# Patient Record
Sex: Male | Born: 1965 | Race: White | Hispanic: No | Marital: Married | Smoking: Current every day smoker
Health system: Southern US, Community
[De-identification: ages and names within clinical notes are randomized; demographics above are authoritative.]

## PROBLEM LIST (undated history)

## (undated) DIAGNOSIS — I10 Essential (primary) hypertension: Secondary | ICD-10-CM

## (undated) DIAGNOSIS — J45909 Unspecified asthma, uncomplicated: Secondary | ICD-10-CM

## (undated) DIAGNOSIS — E119 Type 2 diabetes mellitus without complications: Secondary | ICD-10-CM

## (undated) DIAGNOSIS — G4730 Sleep apnea, unspecified: Secondary | ICD-10-CM

## (undated) HISTORY — PX: DENTAL SURGERY: SHX609

---

## 2006-02-28 ENCOUNTER — Inpatient Hospital Stay (HOSPITAL_COMMUNITY): Admission: AD | Admit: 2006-02-28 | Discharge: 2006-03-03 | Payer: Self-pay | Admitting: Psychiatry

## 2006-02-28 ENCOUNTER — Ambulatory Visit: Payer: Self-pay | Admitting: Psychiatry

## 2014-07-06 ENCOUNTER — Encounter (HOSPITAL_COMMUNITY): Payer: Self-pay | Admitting: *Deleted

## 2014-07-06 ENCOUNTER — Emergency Department (HOSPITAL_COMMUNITY)
Admission: EM | Admit: 2014-07-06 | Discharge: 2014-07-06 | Disposition: A | Discharge status: Home / Self Care | Payer: Self-pay | Attending: Emergency Medicine | Admitting: Emergency Medicine

## 2014-07-06 ENCOUNTER — Emergency Department (HOSPITAL_COMMUNITY): Payer: Self-pay

## 2014-07-06 DIAGNOSIS — R062 Wheezing: Secondary | ICD-10-CM | POA: Insufficient documentation

## 2014-07-06 DIAGNOSIS — R51 Headache: Secondary | ICD-10-CM | POA: Insufficient documentation

## 2014-07-06 DIAGNOSIS — I10 Essential (primary) hypertension: Secondary | ICD-10-CM | POA: Insufficient documentation

## 2014-07-06 DIAGNOSIS — Z720 Tobacco use: Secondary | ICD-10-CM | POA: Insufficient documentation

## 2014-07-06 DIAGNOSIS — H538 Other visual disturbances: Secondary | ICD-10-CM

## 2014-07-06 LAB — CBC WITH DIFFERENTIAL/PLATELET
BASOS PCT: 1 % (ref 0–1)
Basophils Absolute: 0.1 10*3/uL (ref 0.0–0.1)
EOS ABS: 0.2 10*3/uL (ref 0.0–0.7)
Eosinophils Relative: 2 % (ref 0–5)
HCT: 43.1 % (ref 39.0–52.0)
Hemoglobin: 14.4 g/dL (ref 13.0–17.0)
Lymphocytes Relative: 27 % (ref 12–46)
Lymphs Abs: 2.4 10*3/uL (ref 0.7–4.0)
MCH: 29.1 pg (ref 26.0–34.0)
MCHC: 33.4 g/dL (ref 30.0–36.0)
MCV: 87.2 fL (ref 78.0–100.0)
MONO ABS: 0.5 10*3/uL (ref 0.1–1.0)
Monocytes Relative: 5 % (ref 3–12)
NEUTROS ABS: 6 10*3/uL (ref 1.7–7.7)
Neutrophils Relative %: 65 % (ref 43–77)
Platelets: 218 10*3/uL (ref 150–400)
RBC: 4.94 MIL/uL (ref 4.22–5.81)
RDW: 13.4 % (ref 11.5–15.5)
WBC: 9.1 10*3/uL (ref 4.0–10.5)

## 2014-07-06 LAB — I-STAT CHEM 8, ED
BUN: 21 mg/dL (ref 6–23)
CALCIUM ION: 1.09 mmol/L — AB (ref 1.12–1.23)
CREATININE: 1.2 mg/dL (ref 0.50–1.35)
Chloride: 103 mmol/L (ref 96–112)
GLUCOSE: 91 mg/dL (ref 70–99)
HCT: 46 % (ref 39.0–52.0)
Hemoglobin: 15.6 g/dL (ref 13.0–17.0)
Potassium: 3.6 mmol/L (ref 3.5–5.1)
Sodium: 141 mmol/L (ref 135–145)
TCO2: 23 mmol/L (ref 0–100)

## 2014-07-06 MED ORDER — LISINOPRIL-HYDROCHLOROTHIAZIDE 20-25 MG PO TABS: 1.0000 | ORAL_TABLET | Freq: Every day | ORAL | Status: DC

## 2014-07-06 NOTE — ED Notes (Signed)
Pt A&OX4, NAD noted. Dr. Maryan Rued aware of BP. Pt denies any other complaints. Pt refused wheelchair on discharge.

## 2014-07-06 NOTE — ED Notes (Signed)
Spoke with MD Maryan Rued regarding patient symptoms, orders received

## 2014-07-06 NOTE — ED Notes (Addendum)
Pt in c/o intermittent blurred vision, floaters in vision, headaches for the last few months, went to his eye doctor today regarding these symptoms and sent here to r/o hemorrhage to vessels in eyes, told he needed a CT scan, pt hypertensive in triage, no known history of this, pt denies any acute changes in symptoms today, just reports that they have been getting progressively worse in the last month

## 2014-07-06 NOTE — ED Notes (Signed)
MD made aware of the plan of care. Patient okay with BP.

## 2014-07-06 NOTE — Progress Notes (Signed)
ED CM was consulted by Dr. Maryan Rued concerning establishing f/u care. Patient was sent here by ophthalmologist for blurred vision and floaters to r/o intrcranial hemorrhage. Patient has not seen a doctor in about 3 years. Patient lives in [place]. Discussed the [facility] in [place] Dr. Moshe Cipro, patient agreeable with plan to follow up tomorrow with establishing care. Patient also given information to on the [facility], patient verbalized appreciation for the assistance. CM will follow up with PCP and patient regarding appt. Updated Dr. Maryan Rued on disposition plan she is agreeable.

## 2014-07-06 NOTE — ED Provider Notes (Addendum)
CSN: [id number]     Arrival date & time 07/06/14  1834 History   First MD Initiated Contact with Patient 07/06/14 1946     Chief Complaint  Patient presents with  . Blurred Vision     (Consider location/radiation/quality/duration/timing/severity/associated sxs/prior Treatment) HPI Comments: Patient presented to the emergency room today because for the last 3 months or so he's had headaches and about a week ago he started having floaters in blurry vision. He saw Dr. Katy Fitch with ophthalmology today who recommended he come here to get a CT scan to rule out intracranial hemorrhage. Patient states he has not seen a doctor in at least 3 years and has no idea if he has any medical problems. Because he does not have insurance he does not see a doctor regularly  Patient is a 49 y.o. male presenting with headaches. The history is provided by the patient.  Headache Pain location:  Generalized Quality:  Dull Radiates to:  Does not radiate Severity currently:  Unable to specify Severity at highest:  Unable to specify Onset quality:  Gradual Duration: 3 months. Timing:  Intermittent Progression:  Waxing and waning Chronicity:  New Associated symptoms: blurred vision   Associated symptoms: no cough, no dizziness, no facial pain, no fever, no focal weakness, no hearing loss, no loss of balance, no photophobia and no weakness   Risk factors comment:  Tobacco abuse.  otherwise does not see a doctor   History reviewed. No pertinent past medical history. History reviewed. No pertinent past surgical history. History reviewed. No pertinent family history. History  Substance Use Topics  . Smoking status: Current Every Day Smoker  . Smokeless tobacco: Not on file  . Alcohol Use: Not on file    Review of Systems  Constitutional: Negative for fever.  HENT: Negative for hearing loss.   Eyes: Positive for blurred vision. Negative for photophobia.  Respiratory: Negative for cough.   Neurological:  Positive for headaches. Negative for dizziness, focal weakness, weakness and loss of balance.  All other systems reviewed and are negative.     Allergies  Review of patient's allergies indicates no known allergies.  Home Medications   Prior to Admission medications   Medication Sig Start Date End Date Taking? Authorizing Provider  naproxen sodium (ANAPROX) 220 MG tablet Take 220 mg by mouth daily as needed (headaches).   Yes Historical Provider, MD   BP 212/128 mmHg  Pulse 88  Temp(Src) 97.9 F (36.6 C) (Oral)  Resp 14  Wt 215 lb 1 oz (97.552 kg)  SpO2 98% Physical Exam  Constitutional: He is oriented to person, place, and time. He appears well-developed and well-nourished. No distress.  HENT:  Head: Normocephalic and atraumatic.  Mouth/Throat: Oropharynx is clear and moist.  Eyes: Conjunctivae and EOM are normal.  Pupils are dilated bilaterally  Neck: Normal range of motion. Neck supple.  Cardiovascular: Normal rate, regular rhythm and intact distal pulses.   No murmur heard. Pulmonary/Chest: Effort normal. No respiratory distress. He has wheezes. He has no rales.  Abdominal: Soft. He exhibits no distension. There is no tenderness. There is no rebound and no guarding.  Musculoskeletal: Normal range of motion. He exhibits no edema or tenderness.  Neurological: He is alert and oriented to person, place, and time. He has normal strength. No cranial nerve deficit or sensory deficit. Coordination and gait normal.  Skin: Skin is warm and dry. No rash noted. No erythema.  Psychiatric: He has a normal mood and affect. His behavior is normal.  Nursing note and vitals reviewed.   ED Course  Procedures (including critical care time) Labs Review Labs Reviewed  I-STAT CHEM 8, ED - Abnormal; Notable for the following:    Calcium, Ion 1.09 (*)    All other components within normal limits  CBC WITH DIFFERENTIAL/PLATELET    Imaging Review Ct Head Wo Contrast  07/06/2014   CLINICAL  DATA:  Intermittent blurry vision. Headaches for last few months.  EXAM: CT HEAD WITHOUT CONTRAST  TECHNIQUE: Contiguous axial images were obtained from the base of the skull through the vertex without intravenous contrast.  COMPARISON:  None.  FINDINGS: There is no evidence of mass effect, midline shift or extra-axial fluid collections. There is no evidence of a space-occupying lesion or intracranial hemorrhage. There is no evidence of a cortical-based area of acute infarction.  The ventricles and sulci are appropriate for the patient's age. The basal cisterns are patent. Prominent cisterna magna.  Visualized portions of the orbits are unremarkable. Right maxillary sinus mucosal thickening. Minimal left maxillary sinus mucosal thickening. Bilateral ethmoid sinus and sphenoid sinus mucosal thickening. Hypoplastic left frontal sinus. Mastoid sinuses are unremarkable. Low-density material within bilateral external auditory canals likely reflecting cerumen.  The osseous structures are unremarkable.  IMPRESSION: 1. Normal CT of the brain without intravenous contrast. 2. Mild sinus disease.   Electronically Signed   By: Kathreen Devoid   On: 07/06/2014 19:39     EKG Interpretation   Date/Time:  Thursday July 06 2014 18:50:44 EST Ventricular Rate:  90 PR Interval:  140 QRS Duration: 84 QT Interval:  368 QTC Calculation: 450 R Axis:   78 Text Interpretation:  Normal sinus rhythm Minimal voltage criteria for  LVH, may be normal variant ST \\T \ T wave abnormality, consider  inferolateral ischemia No previous tracing Confirmed by Maryan Rued  MD,  Loree Fee ([id number]) on 07/06/2014 7:46:50 PM      MDM   Final diagnoses:  Essential hypertension    Patient here with headaches for the last 2-3 months and floaters and blurred vision starting in the last week. Patient saw Dr. Katy Fitch today in the office in speaking with me he saw me he saw vitreous hemorrhage and papilledema and she sent the patient here to rule out  intracranial hemorrhage. Patient denies any unilateral weakness, numbness, speech or swallowing difficulty. He has not seen a doctor in greater than 3 years and does not take care of his health. He abuses tobacco and eats an excessive amount of salty foods. Upon arrival here patient's blood pressure is 220/150 and repeat check was 212/128. Feel most likely that patient's blood pressure has been uncontrolled for some time. Spoke with neurology about patient's ophthalmologic finding and they feel that a CT scan is adequate to rule out intracranial hemorrhage however with the papilledema recommended that he have outpatient neurology follow-up for pseudotumor cerebri, however with patient's elevated blood pressure today the readings with most likely be abnormal. He recommended blood pressure treatment in the meantime. Patient's CBC and Chem-8 are within normal limits. No signs of diabetes at this time. Will start patient on enalapril and hydrochlorothiazide for his blood pressure and given resources in [place] for primary care follow-up. Also given a neurology referral for next week.  Spoke with wanda with case management and [facility] and pt given information.    Blanchie Dessert, MD 07/06/14 2045  Blanchie Dessert, MD 07/06/14 2107

## 2014-07-06 NOTE — Discharge Instructions (Signed)
Blurred Vision You have been seen today complaining of blurred vision. This means you have a loss of ability to see small details.  CAUSES  Blurred vision can be a symptom of underlying eye problems, such as:  Aging of the eye (presbyopia).  Glaucoma.  Cataracts.  Eye infection.  Eye-related migraine.  Diabetes mellitus.  Fatigue.  Migraine headaches.  High blood pressure.  Breakdown of the back of the eye (macular degeneration).  Problems caused by some medications. The most common cause of blurred vision is the need for eyeglasses or a new prescription. Today in the emergency department, no cause for your blurred vision can be found. SYMPTOMS  Blurred vision is the loss of visual sharpness and detail (acuity). DIAGNOSIS  Should blurred vision continue, you should see your caregiver. If your caregiver is your primary care physician, he or she may choose to refer you to another specialist.  TREATMENT  Do not ignore your blurred vision. Make sure to have it checked out to see if further treatment or referral is necessary. SEEK MEDICAL CARE IF:  You are unable to get into a specialist so we can help you with a referral. SEEK IMMEDIATE MEDICAL CARE IF: You have severe eye pain, severe headache, or sudden loss of vision. MAKE SURE YOU:   Understand these instructions.  Will watch your condition.  Will get help right away if you are not doing well or get worse. Document Released: 04/24/2003 Document Revised: 07/14/2011 Document Reviewed: 11/24/2007 [facility] Patient Information 2015 [place], [place]. This information is not intended to replace advice given to you by your health care provider. Make sure you discuss any questions you have with your health care provider.  DASH Eating Plan DASH stands for "Dietary Approaches to Stop Hypertension." The DASH eating plan is a healthy eating plan that has been shown to reduce high blood pressure (hypertension). Additional health  benefits may include reducing the risk of type 2 diabetes mellitus, heart disease, and stroke. The DASH eating plan may also help with weight loss. WHAT DO I NEED TO KNOW ABOUT THE DASH EATING PLAN? For the DASH eating plan, you will follow these general guidelines:  Choose foods with a percent daily value for sodium of less than 5% (as listed on the food label).  Use salt-free seasonings or herbs instead of table salt or sea salt.  Check with your health care provider or pharmacist before using salt substitutes.  Eat lower-sodium products, often labeled as "lower sodium" or "no salt added."  Eat fresh foods.  Eat more vegetables, fruits, and low-fat dairy products.  Choose whole grains. Look for the word "whole" as the first word in the ingredient list.  Choose fish and skinless chicken or [place] more often than red meat. Limit fish, poultry, and meat to 6 oz (170 g) each day.  Limit sweets, desserts, sugars, and sugary drinks.  Choose heart-healthy fats.  Limit cheese to 1 oz (28 g) per day.  Eat more home-cooked food and less restaurant, buffet, and fast food.  Limit fried foods.  Cook foods using methods other than frying.  Limit canned vegetables. If you do use them, rinse them well to decrease the sodium.  When eating at a restaurant, ask that your food be prepared with less salt, or no salt if possible. WHAT FOODS CAN I EAT? Seek help from a dietitian for individual calorie needs. Grains Whole grain or whole wheat bread. Brown rice. Whole grain or whole wheat pasta. Quinoa, bulgur, and whole  grain cereals. Low-sodium cereals. Corn or whole wheat flour tortillas. Whole grain cornbread. Whole grain crackers. Low-sodium crackers. Vegetables Fresh or frozen vegetables (raw, steamed, roasted, or grilled). Low-sodium or reduced-sodium tomato and vegetable juices. Low-sodium or reduced-sodium tomato sauce and paste. Low-sodium or reduced-sodium canned vegetables.  Fruits All  fresh, canned (in natural juice), or frozen fruits. Meat and Other Protein Products Ground beef (85% or leaner), grass-fed beef, or beef trimmed of fat. Skinless chicken or [place]. Ground chicken or [place]. Pork trimmed of fat. All fish and seafood. Eggs. Dried beans, peas, or lentils. Unsalted nuts and seeds. Unsalted canned beans. Dairy Low-fat dairy products, such as skim or 1% milk, 2% or reduced-fat cheeses, low-fat ricotta or cottage cheese, or plain low-fat yogurt. Low-sodium or reduced-sodium cheeses. Fats and Oils Tub margarines without trans fats. Light or reduced-fat mayonnaise and salad dressings (reduced sodium). Avocado. Safflower, olive, or canola oils. Natural peanut or almond butter. Other Unsalted popcorn and pretzels. The items listed above may not be a complete list of recommended foods or beverages. Contact your dietitian for more options. WHAT FOODS ARE NOT RECOMMENDED? Grains White bread. White pasta. White rice. Refined cornbread. Bagels and croissants. Crackers that contain trans fat. Vegetables Creamed or fried vegetables. Vegetables in a cheese sauce. Regular canned vegetables. Regular canned tomato sauce and paste. Regular tomato and vegetable juices. Fruits Dried fruits. Canned fruit in light or heavy syrup. Fruit juice. Meat and Other Protein Products Fatty cuts of meat. Ribs, chicken wings, bacon, sausage, bologna, salami, chitterlings, fatback, hot dogs, bratwurst, and packaged luncheon meats. Salted nuts and seeds. Canned beans with salt. Dairy Whole or 2% milk, cream, half-and-half, and cream cheese. Whole-fat or sweetened yogurt. Full-fat cheeses or blue cheese. Nondairy creamers and whipped toppings. Processed cheese, cheese spreads, or cheese curds. Condiments Onion and garlic salt, seasoned salt, table salt, and sea salt. Canned and packaged gravies. Worcestershire sauce. Tartar sauce. Barbecue sauce. Teriyaki sauce. Soy sauce, including reduced sodium.  Steak sauce. Fish sauce. Oyster sauce. Cocktail sauce. Horseradish. Ketchup and mustard. Meat flavorings and tenderizers. Bouillon cubes. Hot sauce. Tabasco sauce. Marinades. Taco seasonings. Relishes. Fats and Oils Butter, stick margarine, lard, shortening, ghee, and bacon fat. Coconut, palm kernel, or palm oils. Regular salad dressings. Other Pickles and olives. Salted popcorn and pretzels. The items listed above may not be a complete list of foods and beverages to avoid. Contact your dietitian for more information. WHERE CAN I FIND MORE INFORMATION? National Heart, Lung, and Blood Institute: travelstabloid.com Document Released: 04/10/2011 Document Revised: 09/05/2013 Document Reviewed: 02/23/2013 [facility] Patient Information 2015 [place], [place]. This information is not intended to replace advice given to you by your health care provider. Make sure you discuss any questions you have with your health care provider.

## 2015-09-24 ENCOUNTER — Emergency Department (HOSPITAL_COMMUNITY)
Admission: EM | Admit: 2015-09-24 | Discharge: 2015-09-24 | Disposition: A | Discharge status: Home / Self Care | Payer: Self-pay | Attending: Emergency Medicine | Admitting: Emergency Medicine

## 2015-09-24 ENCOUNTER — Encounter (HOSPITAL_COMMUNITY): Payer: Self-pay | Admitting: *Deleted

## 2015-09-24 DIAGNOSIS — I159 Secondary hypertension, unspecified: Secondary | ICD-10-CM

## 2015-09-24 DIAGNOSIS — Z79899 Other long term (current) drug therapy: Secondary | ICD-10-CM | POA: Insufficient documentation

## 2015-09-24 DIAGNOSIS — Z7982 Long term (current) use of aspirin: Secondary | ICD-10-CM | POA: Insufficient documentation

## 2015-09-24 DIAGNOSIS — F17210 Nicotine dependence, cigarettes, uncomplicated: Secondary | ICD-10-CM | POA: Insufficient documentation

## 2015-09-24 DIAGNOSIS — L259 Unspecified contact dermatitis, unspecified cause: Secondary | ICD-10-CM

## 2015-09-24 HISTORY — DX: Essential (primary) hypertension: I10

## 2015-09-24 LAB — CBC WITH DIFFERENTIAL/PLATELET
BASOS PCT: 1 %
Basophils Absolute: 0 10*3/uL (ref 0.0–0.1)
EOS PCT: 3 %
Eosinophils Absolute: 0.3 10*3/uL (ref 0.0–0.7)
HCT: 41.1 % (ref 39.0–52.0)
Hemoglobin: 13.4 g/dL (ref 13.0–17.0)
Lymphocytes Relative: 22 %
Lymphs Abs: 1.9 10*3/uL (ref 0.7–4.0)
MCH: 28.9 pg (ref 26.0–34.0)
MCHC: 32.6 g/dL (ref 30.0–36.0)
MCV: 88.6 fL (ref 78.0–100.0)
Monocytes Absolute: 0.5 10*3/uL (ref 0.1–1.0)
Monocytes Relative: 6 %
Neutro Abs: 5.8 10*3/uL (ref 1.7–7.7)
Neutrophils Relative %: 68 %
PLATELETS: 224 10*3/uL (ref 150–400)
RBC: 4.64 MIL/uL (ref 4.22–5.81)
RDW: 14 % (ref 11.5–15.5)
WBC: 8.5 10*3/uL (ref 4.0–10.5)

## 2015-09-24 LAB — COMPREHENSIVE METABOLIC PANEL
ALT: 29 U/L (ref 17–63)
AST: 31 U/L (ref 15–41)
Albumin: 3.8 g/dL (ref 3.5–5.0)
Alkaline Phosphatase: 93 U/L (ref 38–126)
Anion gap: 7 (ref 5–15)
BUN: 18 mg/dL (ref 6–20)
CHLORIDE: 107 mmol/L (ref 101–111)
CO2: 26 mmol/L (ref 22–32)
CREATININE: 1.49 mg/dL — AB (ref 0.61–1.24)
Calcium: 8.3 mg/dL — ABNORMAL LOW (ref 8.9–10.3)
GFR calc non Af Amer: 53 mL/min — ABNORMAL LOW (ref >60)
Glucose, Bld: 163 mg/dL — ABNORMAL HIGH (ref 65–99)
Potassium: 3.5 mmol/L (ref 3.5–5.1)
Sodium: 140 mmol/L (ref 135–145)
Total Bilirubin: 0.4 mg/dL (ref 0.3–1.2)
Total Protein: 6.9 g/dL (ref 6.5–8.1)

## 2015-09-24 MED ORDER — PREDNISONE 20 MG PO TABS: ORAL_TABLET | ORAL | Status: DC

## 2015-09-24 MED ORDER — PREDNISONE 50 MG PO TABS
60.0000 mg | ORAL_TABLET | Freq: Once | ORAL | Status: AC
  Administered 2015-09-24: 60 mg via ORAL
  Filled 2015-09-24: qty 1

## 2015-09-24 MED ORDER — LISINOPRIL 10 MG PO TABS
10.0000 mg | ORAL_TABLET | Freq: Once | ORAL | Status: AC
  Administered 2015-09-24: 10 mg via ORAL
  Filled 2015-09-24: qty 1

## 2015-09-24 MED ORDER — LISINOPRIL-HYDROCHLOROTHIAZIDE 20-25 MG PO TABS: 1.0000 | ORAL_TABLET | Freq: Every day | ORAL | Status: DC

## 2015-09-24 NOTE — ED Provider Notes (Signed)
CSN: [id number]     Arrival date & time 09/24/15  2110 History  By signing my name below, I, Doran Stabler, attest that this documentation has been prepared under the direction and in the presence of No att. providers found. Electronically Signed: Doran Stabler, ED Scribe. 09/25/2015. 11:30 PM.   Chief Complaint  Patient presents with  . Hypertension  . Poison Ivy   The history is provided by the patient. No language interpreter was used.   HPI Comments: Gary Ho is a 50 y.o. male with a PMHx of HTN who presents to the Emergency Department complaining of poison ivy exposure to his left periorbital area 1 day ago. Pt was cutting poison oak yesterday without safety glasses when he became exposed. Since then, he complains of itchiness and redness to the left  periorbital area. He states his forearms were exposed to poison ivy before and had a reaction. Pt denies any fevers, chills, eye itching, eye drainage, eye pain or any other symptoms at this time.    Pt has not been taken his Lisinopril for the past 6 months. Pt does not see a PCP.   Past Medical History  Diagnosis Date  . Hypertension    History reviewed. No pertinent past surgical history. History reviewed. No pertinent family history. Social History  Substance Use Topics  . Smoking status: Current Every Day Smoker    Types: Cigarettes  . Smokeless tobacco: None  . Alcohol Use: No    Review of Systems  Constitutional: Negative for fever and chills.  Eyes: Negative for pain, discharge and itching.  Skin: Positive for rash.  All other systems reviewed and are negative.   Allergies  Poison sumac extract  Home Medications   Prior to Admission medications   Medication Sig Start Date End Date Taking? Authorizing Provider  aspirin EC 81 MG tablet Take 81 mg by mouth daily.   Yes Historical Provider, MD  diphenhydrAMINE (BENADRYL) 25 MG tablet Take 50 mg by mouth once as needed for allergies.   Yes Historical Provider,  MD  Ergocalciferol (VITAMIN D2) 2000 units TABS Take 2,000 Units by mouth daily.   Yes Historical Provider, MD  Multiple Vitamin (MULTIVITAMIN WITH MINERALS) TABS tablet Take 1 tablet by mouth daily.   Yes Historical Provider, MD  lisinopril-hydrochlorothiazide (PRINZIDE,ZESTORETIC) 20-25 MG tablet Take 1 tablet by mouth daily. 09/24/15   Merrily Pew, MD  predniSONE (DELTASONE) 20 MG tablet 3 tabs po daily x 3 days, then 2 tabs x 3 days, then 1.5 tabs x 3 days, then 1 tab x 3 days, then 0.5 tabs x 3 days 09/24/15   Merrily Pew, MD   BP 189/138 mmHg  Pulse 78  Temp(Src) 98 F (36.7 C) (Oral)  Resp 16  Ht 6\' 1"  (1.854 m)  Wt 218 lb (98.884 kg)  BMI 28.77 kg/m2  SpO2 98%   Physical Exam  Constitutional: He is oriented to person, place, and time. He appears well-developed and well-nourished.  HENT:  Head: Normocephalic and atraumatic.  Eyes: Conjunctivae and EOM are normal. Pupils are equal, round, and reactive to light.  Periorbital edema and erythema, crusted vesicular lesions to the left lateral eye.   Cardiovascular: Normal rate.   Pulmonary/Chest: Effort normal.  Musculoskeletal:  5 cm x 3 cm scaly lesion to the left anterior shin.  Neurological: He is alert and oriented to person, place, and time.  Skin: Skin is warm and dry.  Psychiatric: He has a normal mood and affect.  Nursing note  and vitals reviewed.   ED Course  Procedures  DIAGNOSTIC STUDIES: Oxygen Saturation is 97% on room air, normal by my interpretation.    COORDINATION OF CARE: 11:18 PM Will give prednisone and lisinopril. Will order blood work. Discussed treatment plan with pt at bedside and pt agreed to plan.  Labs Review Labs Reviewed  COMPREHENSIVE METABOLIC PANEL - Abnormal; Notable for the following:    Glucose, Bld 163 (*)    Creatinine, Ser 1.49 (*)    Calcium 8.3 (*)    GFR calc non Af Amer 53 (*)    All other components within normal limits  CBC WITH DIFFERENTIAL/PLATELET    Imaging  Review No results found. I have personally reviewed and evaluated these images and lab results as part of my medical decision-making.   EKG Interpretation None      MDM   Final diagnoses:  Secondary hypertension, unspecified  Contact dermatitis    Rash next to eye after cutting poison ivy and suspect this the cause of his contact dermatitis. Considered varicella however there is no pain involved and rash is not typical for that. No vision changes. No EOM abnormalities to suggest more serious causes for symptoms. 2/2 proximity to eye, will start on systemic steroids. Also has asymptomatic hypertension. Restarted home medication (ran out a few months ago) and fiance will assist in getting him an appointment.   New Prescriptions: Discharge Medication List as of 09/24/2015 11:33 PM    START taking these medications   Details  predniSONE (DELTASONE) 20 MG tablet 3 tabs po daily x 3 days, then 2 tabs x 3 days, then 1.5 tabs x 3 days, then 1 tab x 3 days, then 0.5 tabs x 3 days, Print         I have personally and contemperaneously reviewed labs and imaging and used in my decision making as above.   A medical screening exam was performed and I feel the patient has had an appropriate workup for their chief complaint at this time and likelihood of emergent condition existing is low and thus workup can continue on an outpatient basis.. Their vital signs are stable. They have been counseled on decision, discharge, follow up and which symptoms necessitate immediate return to the emergency department.  They verbally stated understanding and agreement with plan and discharged in stable condition.    I personally performed the services described in this documentation, which was scribed in my presence. The recorded information has been reviewed and is accurate.    Merrily Pew, MD 09/25/15 [phone]

## 2015-09-24 NOTE — ED Notes (Signed)
Physician in to assess 

## 2015-09-24 NOTE — ED Notes (Addendum)
Pt states he got into some poison ivy yesterday. Pt has redness and swelling to left eye today. Pt took 2 benadryl without relief.

## 2015-09-24 NOTE — ED Notes (Signed)
Pt reports that he has poison oak to his left eye- He reports he contacted it yesterday He also reports that he missed a doctors appt to refill his HTN meds several months ago and got angry when he was charged for the missed appt and never went back. He further reports he has had no htn meds for several months.

## 2015-10-18 ENCOUNTER — Encounter: Payer: Self-pay | Admitting: Family Medicine

## 2015-10-18 ENCOUNTER — Ambulatory Visit (INDEPENDENT_AMBULATORY_CARE_PROVIDER_SITE_OTHER): Payer: Self-pay | Admitting: Family Medicine

## 2015-10-18 VITALS — BP 102/68 | HR 87 | Temp 97.6°F | Ht 70.0 in | Wt 219.4 lb

## 2015-10-18 DIAGNOSIS — F17200 Nicotine dependence, unspecified, uncomplicated: Secondary | ICD-10-CM

## 2015-10-18 DIAGNOSIS — Z720 Tobacco use: Secondary | ICD-10-CM

## 2015-10-18 DIAGNOSIS — I10 Essential (primary) hypertension: Secondary | ICD-10-CM

## 2015-10-18 DIAGNOSIS — J449 Chronic obstructive pulmonary disease, unspecified: Secondary | ICD-10-CM

## 2015-10-18 DIAGNOSIS — R252 Cramp and spasm: Secondary | ICD-10-CM

## 2015-10-18 DIAGNOSIS — L570 Actinic keratosis: Secondary | ICD-10-CM

## 2015-10-18 MED ORDER — AMLODIPINE BESYLATE 5 MG PO TABS: 5.0000 mg | ORAL_TABLET | Freq: Every day | ORAL | Status: DC

## 2015-10-18 MED ORDER — VARENICLINE TARTRATE 0.5 MG X 11 & 1 MG X 42 PO MISC: ORAL | Status: DC

## 2015-10-18 NOTE — Progress Notes (Signed)
Subjective:  Patient ID: Gary Ho, male    DOB: [birth year]  Age: 50 y.o. MRN: [id number]  CC: Establish Care and Hypertension   HPI Hassel Uphoff presents for transferring care from previous M.D. Out of med for about a month. Has been having problems with dizziness. Lightheadedness, near syncope and recent episode of syncope. Lasting a few moments. Pt. Also reports many years of smoking. INterested in quitting. Some DOE for extended exertion such as climbing multiple flights of stairs. Has used an inhaler briefly in the past for bronchitis.  History Oney has a past medical history of Hypertension.   He has no past surgical history on file.   His family history is not on file.He reports that he has been smoking Cigarettes.  He does not have any smokeless tobacco history on file. He reports that he does not drink alcohol or use illicit drugs.  Current Outpatient Prescriptions on File Prior to Visit  Medication Sig Dispense Refill  . Multiple Vitamin (MULTIVITAMIN WITH MINERALS) TABS tablet Take 1 tablet by mouth daily.    Marland Kitchen aspirin EC 81 MG tablet Take 81 mg by mouth daily. Reported on 10/18/2015    . Ergocalciferol (VITAMIN D2) 2000 units TABS Take 2,000 Units by mouth daily. Reported on 10/18/2015     No current facility-administered medications on file prior to visit.    ROS Review of Systems  Constitutional: Negative for fever, chills, diaphoresis and unexpected weight change.  HENT: Negative for congestion, hearing loss, rhinorrhea and sore throat.   Eyes: Negative for visual disturbance.  Respiratory: Negative for cough and shortness of breath.   Cardiovascular: Negative for chest pain.  Gastrointestinal: Negative for abdominal pain, diarrhea and constipation.  Genitourinary: Negative for dysuria and flank pain.  Musculoskeletal: Negative for joint swelling and arthralgias.  Skin: Negative for rash.  Neurological: Positive for dizziness. Negative for headaches.    Psychiatric/Behavioral: Negative for sleep disturbance and dysphoric mood.    Objective:  BP 102/68 mmHg  Pulse 87  Temp(Src) 97.6 F (36.4 C) (Oral)  Ht _0  (1.778 m)  Wt 219 lb 6.4 oz (99.519 kg)  BMI 31.48 kg/m2  SpO2 96%  Physical Exam  Constitutional: He is oriented to person, place, and time. He appears well-developed and well-nourished. No distress.  HENT:  Head: Normocephalic and atraumatic.  Right Ear: External ear normal.  Left Ear: External ear normal.  Nose: Nose normal.  Mouth/Throat: Oropharynx is clear and moist.  Eyes: Conjunctivae and EOM are normal. Pupils are equal, round, and reactive to light.  Neck: Normal range of motion. Neck supple. No thyromegaly present.  Cardiovascular: Normal rate, regular rhythm and normal heart sounds.   No murmur heard. Pulmonary/Chest: Effort normal. No respiratory distress. He has wheezes (decreased exp phase). He has no rales.  Abdominal: Soft. Bowel sounds are normal. He exhibits no distension. There is no tenderness.  Lymphadenopathy:    He has no cervical adenopathy.  Neurological: He is alert and oriented to person, place, and time. He has normal reflexes.  Skin: Skin is warm and dry.  Psychiatric: He has a normal mood and affect. His behavior is normal. Judgment and thought content normal.    Assessment & Plan:   Sarvesh was seen today for establish care and hypertension.  Diagnoses and all orders for this visit:  Benign essential HTN -     CMP14+EGFR  Cramps, muscle, general -     CMP14+EGFR  Smoker  Actinic keratosis  Chronic obstructive pulmonary disease,  unspecified COPD type ([place])  Other orders -     amLODipine (NORVASC) 5 MG tablet; Take 1 tablet (5 mg total) by mouth daily. For blood pressure -     varenicline (CHANTIX STARTING MONTH PAK) 0.5 MG X 11 & 1 MG X 42 tablet; Take one 0.5 mg tablet by mouth once daily for 3 days, then increase to one 0.5 mg tablet twice daily for 4 days, then increase  to one 1 mg tablet twice daily.  I have discontinued Mr. Prehn diphenhydrAMINE, lisinopril-hydrochlorothiazide, and predniSONE. I am also having him start on amLODipine and varenicline. Additionally, I am having him maintain his aspirin EC, multivitamin with minerals, and Vitamin D2.  Meds ordered this encounter  Medications  . amLODipine (NORVASC) 5 MG tablet    Sig: Take 1 tablet (5 mg total) by mouth daily. For blood pressure    Dispense:  30 tablet    Refill:  5  . varenicline (CHANTIX STARTING MONTH PAK) 0.5 MG X 11 & 1 MG X 42 tablet    Sig: Take one 0.5 mg tablet by mouth once daily for 3 days, then increase to one 0.5 mg tablet twice daily for 4 days, then increase to one 1 mg tablet twice daily.    Dispense:  53 tablet    Refill:  0     Follow-up: Return in about 5 months (around 03/19/2016), or if symptoms worsen or fail to improve, for hypertension, actinic keratosis.  Claretta Fraise, M.D.

## 2015-10-19 LAB — CMP14+EGFR
ALT: 26 IU/L (ref 0–44)
AST: 20 IU/L (ref 0–40)
Albumin/Globulin Ratio: 1.8 (ref 1.2–2.2)
Albumin: 4.4 g/dL (ref 3.5–5.5)
Alkaline Phosphatase: 93 IU/L (ref 39–117)
BUN/Creatinine Ratio: 18 (ref 9–20)
BUN: 45 mg/dL — AB (ref 6–24)
Bilirubin Total: 0.3 mg/dL (ref 0.0–1.2)
CALCIUM: 9.9 mg/dL (ref 8.7–10.2)
CO2: 24 mmol/L (ref 18–29)
CREATININE: 2.52 mg/dL — AB (ref 0.76–1.27)
Chloride: 96 mmol/L (ref 96–106)
GFR calc Af Amer: 33 mL/min/{1.73_m2} — ABNORMAL LOW (ref >59)
GFR calc non Af Amer: 29 mL/min/{1.73_m2} — ABNORMAL LOW (ref >59)
GLOBULIN, TOTAL: 2.5 g/dL (ref 1.5–4.5)
GLUCOSE: 74 mg/dL (ref 65–99)
Potassium: 3.9 mmol/L (ref 3.5–5.2)
SODIUM: 139 mmol/L (ref 134–144)
Total Protein: 6.9 g/dL (ref 6.0–8.5)

## 2015-10-24 ENCOUNTER — Encounter: Payer: Self-pay | Admitting: Family Medicine

## 2015-10-24 ENCOUNTER — Ambulatory Visit (INDEPENDENT_AMBULATORY_CARE_PROVIDER_SITE_OTHER): Payer: Self-pay | Admitting: Family Medicine

## 2015-10-24 VITALS — BP 138/89 | HR 82 | Temp 98.0°F | Ht 70.0 in | Wt 226.8 lb

## 2015-10-24 DIAGNOSIS — N179 Acute kidney failure, unspecified: Secondary | ICD-10-CM | POA: Insufficient documentation

## 2015-10-24 DIAGNOSIS — I10 Essential (primary) hypertension: Secondary | ICD-10-CM

## 2015-10-24 NOTE — Progress Notes (Signed)
   HPI  Patient presents today here with acute kidney injury.  Patient had lab work done last week that showed a 1 mg/dL rising creatinine over the last 3-4 weeks.  He had just stopped lisinopril and changed to amlodipine. He thinks that he stays dehydrated most of the time.  He's been using for baby aspirin daily for CAD prophylaxis.  Denies any fever, chills, sweats, dysuria, abdominal pain. He does not have any itching.  PMH: Smoking status noted ROS: Per HPI  Objective: BP 138/89 mmHg  Pulse 82  Temp(Src) 98 F (36.7 C) (Oral)  Ht '5\' 10"'$  (1.778 m)  Wt 226 lb 12.8 oz (102.876 kg)  BMI 32.54 kg/m2 Gen: NAD, alert, cooperative with exam HEENT: NCAT CV: RRR, good S1/S2, no murmur Resp: CTABL, no wheezes, non-labored Ext: No edema, warm Neuro: Alert and oriented, No gross deficits  Assessment and plan:  # Acute kidney injury, probable Patient with rapid worsening of creatinine, his GFR is 29 He was recently on lisinopril which was stopped at the last visit He has no NSAID exposure that is significant, signs of UTI, or other obvious inciting etiology. Patient is self-pay and wants to minimize lab work, repeat BMP today. If worsening or not improved would consider urinalysis and ultrasound of the kidneys  Hypertension Well controlled Continue amlodipine, avoid ACE inhibitor's  Chest pain Typical and atypical features, discussed with patient that if he were not self-pay would likely recommend extensive testing to be sure that it's not his heart. Recommend continuing daily aspirin, low threshold for return for additional testing Consider beta blocker if additional hypertension medications are needed.   Orders Placed This Encounter  Procedures  . [place], MD [facility] 10/24/2015, 4:36 PM

## 2015-10-24 NOTE — Patient Instructions (Addendum)
Great to meet you!  We will call with your labs results within 1 week

## 2015-10-25 LAB — BMP8+EGFR
BUN / CREAT RATIO: 13 (ref 9–20)
BUN: 16 mg/dL (ref 6–24)
CO2: 20 mmol/L (ref 18–29)
CREATININE: 1.21 mg/dL (ref 0.76–1.27)
Calcium: 8.7 mg/dL (ref 8.7–10.2)
Chloride: 104 mmol/L (ref 96–106)
GFR calc Af Amer: 81 mL/min/{1.73_m2} (ref >59)
GFR calc non Af Amer: 70 mL/min/{1.73_m2} (ref >59)
Glucose: 119 mg/dL — ABNORMAL HIGH (ref 65–99)
Potassium: 4.3 mmol/L (ref 3.5–5.2)
Sodium: 143 mmol/L (ref 134–144)

## 2016-01-25 ENCOUNTER — Ambulatory Visit (INDEPENDENT_AMBULATORY_CARE_PROVIDER_SITE_OTHER): Payer: Self-pay | Admitting: Family Medicine

## 2016-01-25 ENCOUNTER — Encounter: Payer: Self-pay | Admitting: Family Medicine

## 2016-01-25 VITALS — BP 140/90 | HR 81 | Temp 98.2°F | Ht 70.0 in | Wt 231.6 lb

## 2016-01-25 DIAGNOSIS — I10 Essential (primary) hypertension: Secondary | ICD-10-CM

## 2016-01-25 DIAGNOSIS — Z23 Encounter for immunization: Secondary | ICD-10-CM

## 2016-01-25 DIAGNOSIS — G4719 Other hypersomnia: Secondary | ICD-10-CM | POA: Insufficient documentation

## 2016-01-25 DIAGNOSIS — Z4802 Encounter for removal of sutures: Secondary | ICD-10-CM

## 2016-01-25 DIAGNOSIS — N400 Enlarged prostate without lower urinary tract symptoms: Secondary | ICD-10-CM

## 2016-01-25 MED ORDER — TAMSULOSIN HCL 0.4 MG PO CAPS: 0.4000 mg | ORAL_CAPSULE | Freq: Every day | ORAL | 11 refills | Status: DC

## 2016-01-25 MED ORDER — AMLODIPINE BESYLATE 5 MG PO TABS: 5.0000 mg | ORAL_TABLET | Freq: Every day | ORAL | 11 refills | Status: DC

## 2016-01-25 NOTE — Progress Notes (Signed)
   HPI  Patient presents today here for suture removal, hypertension follow-up, and discussion about daytime sleepiness, snoring, and nocturia.  Hypertension Good medication compliance, not checking at home Medication is affordable.  Suture removal Sutures placed in the emergency room at [facility] 7 days ago, he had a metal scaffolding board fall and hit him on the head causing complete loss of consciousness for a few moments. He denies any continued headaches throughout this week.  Daytime sleepiness Associated with snoring, periods of apnea, grogginess, and daytime sleepiness.  Nocturia Complains of greater than 5 episodes of nocturia per night. Also with incomplete antiaging, frequency,. Declines digital rectal exam for prostate exam today, patient  PMH: Smoking status noted ROS: Per HPI  Objective: BP 140/90   Pulse 81   Temp 98.2 F (36.8 C) (Oral)   Ht 5\' 10"  (1.778 m)   Wt 231 lb 9.6 oz (105.1 kg)   BMI 33.23 kg/m  Gen: NAD, alert, cooperative with exam HEENT: NCAT, EOMI, PERRL CV: RRR, good S1/S2, no murmur Resp: CTABL, no wheezes, non-labored Ext: No edema, warm Neuro: Alert and oriented, No gross deficits Skin:  4 Prolene sutures removed from his scalp, no bleeding, tolerated well.  I-Pss Score 15, mostly unhappy  Assessment and plan:  # Hypertension Reasonably well-controlled today Continue amlodipine 5 mg   # BPH Nocturia likely due to BPH Trial of Flomax Discussed need for PSA, patient will consider and check prices Offered prostate exam which he declines today  # Daytime sleepiness With snoring, periods of apnea, and grogginess, most likely obstructive sleep apnea When he has insurance coverage I will gladly send him for a sleep study  # Suture removal Tolerated well    Orders Placed This Encounter  Procedures  . Flu Vaccine QUAD 36+ mos IM    Meds ordered this encounter  Medications  . amLODipine (NORVASC) 5 MG tablet   Sig: Take 1 tablet (5 mg total) by mouth daily. For blood pressure    Dispense:  30 tablet    Refill:  11  . tamsulosin (FLOMAX) 0.4 MG CAPS capsule    Sig: Take 1 capsule (0.4 mg total) by mouth daily.    Dispense:  30 capsule    Refill:  [place], MD [facility] 01/25/2016, 5:14 PM

## 2016-01-25 NOTE — Patient Instructions (Signed)
Great to see you!  I have refilled the amlodipine  You need a sleep study and a PSA  I given you Flomax to see if this will help your frequent urination at night.

## 2016-04-22 ENCOUNTER — Ambulatory Visit (INDEPENDENT_AMBULATORY_CARE_PROVIDER_SITE_OTHER): Payer: Self-pay | Admitting: Family Medicine

## 2016-04-22 ENCOUNTER — Other Ambulatory Visit: Payer: Self-pay | Admitting: *Deleted

## 2016-04-22 ENCOUNTER — Encounter: Payer: Self-pay | Admitting: Family Medicine

## 2016-04-22 VITALS — BP 186/118 | HR 81 | Temp 98.0°F | Ht 70.0 in | Wt 232.0 lb

## 2016-04-22 DIAGNOSIS — R739 Hyperglycemia, unspecified: Secondary | ICD-10-CM

## 2016-04-22 DIAGNOSIS — I10 Essential (primary) hypertension: Secondary | ICD-10-CM

## 2016-04-22 MED ORDER — AMLODIPINE BESYLATE 5 MG PO TABS: 5.0000 mg | ORAL_TABLET | Freq: Every day | ORAL | 11 refills | Status: DC

## 2016-04-22 MED ORDER — AMLODIPINE BESYLATE 10 MG PO TABS: 10.0000 mg | ORAL_TABLET | Freq: Every day | ORAL | 1 refills | Status: DC

## 2016-04-22 NOTE — Progress Notes (Signed)
Subjective:  Patient ID: Gary Ho, male    DOB: December 18, 1965  Age: 50 y.o. MRN: [id number]  CC: Hypertension (pt here today for follow up on HTN, also needs bloodwork )   HPI Gary Ho presents for  follow-up of hypertension. Patient has no history of headache chest pain or shortness of breath or recent cough. Patient also denies symptoms of TIA such as numbness weakness lateralizing.  Patient denies side effects from medication. States taking it regularly until today. Gary Ho ran out after yesterday's dose of amlodipine.   History Gary Ho has a past medical history of Hypertension.   Gary Ho has no past surgical history on file.   His family history is not on file.Gary Ho reports that Gary Ho has been smoking Cigarettes.  Gary Ho has a 20.00 pack-year smoking history. Gary Ho has never used smokeless tobacco. Gary Ho reports that Gary Ho does not drink alcohol or use drugs.  Current Outpatient Prescriptions on File Prior to Visit  Medication Sig Dispense Refill  . Ergocalciferol (VITAMIN D2) 2000 units TABS Take 2,000 Units by mouth daily. Reported on 10/18/2015    . Multiple Vitamin (MULTIVITAMIN WITH MINERALS) TABS tablet Take 1 tablet by mouth daily.    . tamsulosin (FLOMAX) 0.4 MG CAPS capsule Take 1 capsule (0.4 mg total) by mouth daily. (Patient not taking: Reported on 04/22/2016) 30 capsule 11   No current facility-administered medications on file prior to visit.     ROS Review of Systems  Constitutional: Negative for chills, diaphoresis, fever and unexpected weight change.  HENT: Negative for congestion, hearing loss, rhinorrhea and sore throat.   Eyes: Negative for visual disturbance.  Respiratory: Negative for cough and shortness of breath.   Cardiovascular: Negative for chest pain.  Gastrointestinal: Negative for abdominal pain, constipation and diarrhea.  Genitourinary: Negative for dysuria and flank pain.  Musculoskeletal: Negative for arthralgias and joint swelling.  Skin: Negative for rash.    Neurological: Negative for dizziness and headaches.  Psychiatric/Behavioral: Negative for dysphoric mood and sleep disturbance.    Objective:  BP (!) 186/118   Pulse 81   Temp 98 F (36.7 C) (Oral)   Ht _0  (1.778 m)   Wt 232 lb (105.2 kg)   BMI 33.29 kg/m   BP Readings from Last 3 Encounters:  04/22/16 (!) 186/118  01/25/16 140/90  10/24/15 138/89    Wt Readings from Last 3 Encounters:  04/22/16 232 lb (105.2 kg)  01/25/16 231 lb 9.6 oz (105.1 kg)  10/24/15 226 lb 12.8 oz (102.9 kg)     Physical Exam  Constitutional: Gary Ho is oriented to person, place, and time. Gary Ho appears well-developed and well-nourished. No distress.  HENT:  Head: Normocephalic and atraumatic.  Right Ear: External ear normal.  Left Ear: External ear normal.  Nose: Nose normal.  Mouth/Throat: Oropharynx is clear and moist.  Eyes: Conjunctivae and EOM are normal. Pupils are equal, round, and reactive to light.  Neck: Normal range of motion. Neck supple. No thyromegaly present.  Cardiovascular: Normal rate, regular rhythm and normal heart sounds.   No murmur heard. Pulmonary/Chest: Effort normal and breath sounds normal. No respiratory distress. Gary Ho has no wheezes. Gary Ho has no rales.  Abdominal: Soft. Bowel sounds are normal. Gary Ho exhibits no distension. There is no tenderness.  Lymphadenopathy:    Gary Ho has no cervical adenopathy.  Neurological: Gary Ho is alert and oriented to person, place, and time. Gary Ho has normal reflexes.  Skin: Skin is warm and dry.  Psychiatric: Gary Ho has a normal mood and affect. His  behavior is normal. Judgment and thought content normal.     Lab Results  Component Value Date   WBC 8.5 09/24/2015   HGB 13.4 09/24/2015   HCT 41.1 09/24/2015   PLT 224 09/24/2015   GLUCOSE 76 04/23/2016   ALT 27 04/23/2016   AST 21 04/23/2016   NA 142 04/23/2016   K 4.1 04/23/2016   CL 101 04/23/2016   CREATININE 1.33 (H) 04/23/2016   BUN 17 04/23/2016   CO2 24 04/23/2016    No results  found.  Assessment & Plan:   Gary Ho was seen today for hypertension.  Diagnoses and all orders for this visit:  Hyperglycemia -     CMP14+EGFR -     Bayer DCA Hb A1c Waived  Essential hypertension  Other orders -     amLODipine (NORVASC) 10 MG tablet; Take 1 tablet (10 mg total) by mouth daily. For blood pressure   I have changed Gary Ho's amLODipine. I am also having him maintain his multivitamin with minerals, Vitamin D2, and tamsulosin.  Meds ordered this encounter  Medications  . amLODipine (NORVASC) 10 MG tablet    Sig: Take 1 tablet (10 mg total) by mouth daily. For blood pressure    Dispense:  90 tablet    Refill:  1     Follow-up: Return in about 1 month (around 05/23/2016).  Claretta Fraise, M.D.

## 2016-04-23 LAB — CMP14+EGFR
ALBUMIN: 4.4 g/dL (ref 3.5–5.5)
ALK PHOS: 103 IU/L (ref 39–117)
ALT: 27 IU/L (ref 0–44)
AST: 21 IU/L (ref 0–40)
Albumin/Globulin Ratio: 1.8 (ref 1.2–2.2)
BILIRUBIN TOTAL: 0.2 mg/dL (ref 0.0–1.2)
BUN/Creatinine Ratio: 13 (ref 9–20)
BUN: 17 mg/dL (ref 6–24)
CHLORIDE: 101 mmol/L (ref 96–106)
CO2: 24 mmol/L (ref 18–29)
Calcium: 10.1 mg/dL (ref 8.7–10.2)
Creatinine, Ser: 1.33 mg/dL — ABNORMAL HIGH (ref 0.76–1.27)
GFR calc non Af Amer: 62 mL/min/{1.73_m2} (ref >59)
GFR, EST AFRICAN AMERICAN: 72 mL/min/{1.73_m2} (ref >59)
GLUCOSE: 76 mg/dL (ref 65–99)
Globulin, Total: 2.5 g/dL (ref 1.5–4.5)
Potassium: 4.1 mmol/L (ref 3.5–5.2)
Sodium: 142 mmol/L (ref 134–144)
TOTAL PROTEIN: 6.9 g/dL (ref 6.0–8.5)

## 2016-04-23 LAB — BAYER DCA HB A1C WAIVED: HB A1C: 5.8 % (ref <7.0)

## 2016-04-25 ENCOUNTER — Telehealth: Payer: Self-pay | Admitting: Family Medicine

## 2016-04-25 NOTE — Telephone Encounter (Signed)
Aware of lab results  

## 2016-05-13 ENCOUNTER — Encounter: Payer: Self-pay | Admitting: *Deleted

## 2016-11-25 ENCOUNTER — Encounter: Payer: Self-pay | Admitting: Family Medicine

## 2016-11-25 ENCOUNTER — Ambulatory Visit (INDEPENDENT_AMBULATORY_CARE_PROVIDER_SITE_OTHER): Payer: Self-pay | Admitting: Family Medicine

## 2016-11-25 VITALS — BP 165/100 | HR 89 | Temp 98.6°F | Ht 70.0 in | Wt 232.0 lb

## 2016-11-25 DIAGNOSIS — I10 Essential (primary) hypertension: Secondary | ICD-10-CM

## 2016-11-25 DIAGNOSIS — N401 Enlarged prostate with lower urinary tract symptoms: Secondary | ICD-10-CM

## 2016-11-25 DIAGNOSIS — L723 Sebaceous cyst: Secondary | ICD-10-CM

## 2016-11-25 DIAGNOSIS — L02212 Cutaneous abscess of back [any part, except buttock]: Secondary | ICD-10-CM

## 2016-11-25 DIAGNOSIS — L089 Local infection of the skin and subcutaneous tissue, unspecified: Secondary | ICD-10-CM

## 2016-11-25 DIAGNOSIS — R351 Nocturia: Secondary | ICD-10-CM

## 2016-11-25 MED ORDER — DOXAZOSIN MESYLATE 4 MG PO TABS: 4.0000 mg | ORAL_TABLET | Freq: Every day | ORAL | 2 refills | Status: DC

## 2016-11-25 MED ORDER — AMLODIPINE BESYLATE 10 MG PO TABS: 10.0000 mg | ORAL_TABLET | Freq: Every day | ORAL | 1 refills | Status: DC

## 2016-11-25 MED ORDER — SULFAMETHOXAZOLE-TRIMETHOPRIM 800-160 MG PO TABS: 1.0000 | ORAL_TABLET | Freq: Two times a day (BID) | ORAL | 0 refills | Status: DC

## 2016-11-25 MED ORDER — ACETAMINOPHEN-CODEINE #3 300-30 MG PO TABS: 1.0000 | ORAL_TABLET | ORAL | 0 refills | Status: DC | PRN

## 2016-11-25 NOTE — Patient Instructions (Signed)

## 2016-11-25 NOTE — Progress Notes (Signed)
Chief Complaint  Patient presents with  . Recurrent Skin Infections    pt here today c/o "boil" on back above his belt that has been there for 2-3 years but started hurting a week ago.    HPI  Patient presents today for Painful swelling at the site of the long-term cyst. Onset 3-4 days ago. Increasing pain and redness and swelling noted since that time. He's had no fever chills or sweats. He's had others that he is either lanced himself or had lanced in other locations. Additionally patient is in for follow-up of blood pressure and states that he has to urinate a lot at nighttime. For that reason he does not want a diuretic. He's been taking the amlodipine daily.  PMH: Smoking status noted ROS: Per HPI  Objective: BP (!) 165/100   Pulse 89   Temp 98.6 F (37 C) (Oral)   Ht 5\' 10"  (1.778 m)   Wt 232 lb (105.2 kg)   BMI 33.29 kg/m  Gen: NAD, alert, cooperative with exam HEENT: NCAT, EOMI, PERRL CV: RRR, good S1/S2, no murmur Resp: CTABL, no wheezes, non-labored Abd: SNTND, Ext: No edema, warm Neuro: Alert and oriented, No gross deficits  Assessment and plan:  1. Essential hypertension   2. Benign prostatic hyperplasia with nocturia   3. Cutaneous abscess of back excluding buttocks   4. Infected sebaceous cyst     Meds ordered this encounter  Medications  . doxazosin (CARDURA) 4 MG tablet    Sig: Take 1 tablet (4 mg total) by mouth at bedtime. To help with urine flow and blood pressure    Dispense:  30 tablet    Refill:  2  . amLODipine (NORVASC) 10 MG tablet    Sig: Take 1 tablet (10 mg total) by mouth daily. For blood pressure    Dispense:  90 tablet    Refill:  1  . sulfamethoxazole-trimethoprim (BACTRIM DS,SEPTRA DS) 800-160 MG tablet    Sig: Take 1 tablet by mouth 2 (two) times daily. Until gone, for infection    Dispense:  20 tablet    Refill:  0  . acetaminophen-codeine (TYLENOL #3) 300-30 MG tablet    Sig: Take 1-2 tablets by mouth every 4 (four) hours as  needed for moderate pain.    Dispense:  24 tablet    Refill:  0    I&D: Region was anesthetized with 2% lidocaine using about 61mL of it. Incision was made on anterior medial aspect of the open wound. Significant serosanguineous, sebaceous and purulent drainage was exuded. Culture was taken. Forceps was used to probe the area and break apart any loculations.Pressure dressing was placed over top. Bleeding was minimal and patient tolerated procedure well.   Follow up as needed.  Claretta Fraise, MD

## 2016-11-25 NOTE — Addendum Note (Signed)
Addended by: Marylin Crosby on: 11/25/2016 05:33 PM   Modules accepted: Orders

## 2016-11-30 LAB — ANAEROBIC AND AEROBIC CULTURE

## 2017-06-19 ENCOUNTER — Encounter: Payer: Self-pay | Admitting: Family Medicine

## 2017-06-19 ENCOUNTER — Ambulatory Visit (INDEPENDENT_AMBULATORY_CARE_PROVIDER_SITE_OTHER): Payer: Self-pay | Admitting: Family Medicine

## 2017-06-19 VITALS — BP 151/94 | HR 86 | Temp 98.8°F | Ht 70.0 in | Wt 239.0 lb

## 2017-06-19 DIAGNOSIS — F17200 Nicotine dependence, unspecified, uncomplicated: Secondary | ICD-10-CM

## 2017-06-19 DIAGNOSIS — I10 Essential (primary) hypertension: Secondary | ICD-10-CM

## 2017-06-19 DIAGNOSIS — Z6834 Body mass index (BMI) 34.0-34.9, adult: Secondary | ICD-10-CM

## 2017-06-19 DIAGNOSIS — E6609 Other obesity due to excess calories: Secondary | ICD-10-CM

## 2017-06-19 DIAGNOSIS — E66811 Obesity, class 1: Secondary | ICD-10-CM

## 2017-06-19 DIAGNOSIS — R1314 Dysphagia, pharyngoesophageal phase: Secondary | ICD-10-CM

## 2017-06-19 LAB — MICROSCOPIC EXAMINATION
BACTERIA UA: NONE SEEN
Epithelial Cells (non renal): NONE SEEN /hpf (ref 0–10)
Renal Epithel, UA: NONE SEEN /hpf
WBC, UA: NONE SEEN /hpf (ref 0–?)

## 2017-06-19 LAB — URINALYSIS, COMPLETE
Bilirubin, UA: NEGATIVE
KETONES UA: NEGATIVE
Leukocytes, UA: NEGATIVE
Nitrite, UA: NEGATIVE
Urobilinogen, Ur: 0.2 mg/dL (ref 0.2–1.0)
pH, UA: 5.5 (ref 5.0–7.5)

## 2017-06-19 MED ORDER — AMLODIPINE BESYLATE 10 MG PO TABS: 10.0000 mg | ORAL_TABLET | Freq: Every day | ORAL | 1 refills | Status: DC

## 2017-06-19 MED ORDER — TRIAMTERENE-HCTZ 37.5-25 MG PO TABS: 1.0000 | ORAL_TABLET | ORAL | 5 refills | Status: DC

## 2017-06-19 NOTE — Progress Notes (Signed)
Subjective:  Patient ID: Gary Ho, male    DOB: [birth year]  Age: 52 y.o. MRN: [id number]  CC: Hypertension (pt here today for routine follow up of his HTN)   HPI Gary Ho presents for  follow-up of hypertension. Patient has no history of headache chest pain or shortness of breath or recent cough. Patient also denies symptoms of TIA such as focal numbness or weakness. Patient checks  blood pressure at home. Readings recently have also been high when he checks them at home usually in the 941-740 systolic range.. Patient denies side effects from medication.  One exception is that he is having some pain in his feet and possible swelling.  He has discontinued the Cardura.  He says that in the past lisinopril caused cramps and muscle pain for him.  Currently he has no insurance.  He can only afford the basic treatments and declines labs as well as further testing of any kind today.  He did agree to have a urinalysis.  Patient declined treatment for BPH today after having discontinued the Cardura several months ago.  History [name] has a past medical history of Hypertension.   He has no past surgical history on file.   His family history is not on file.He reports that he has been smoking cigarettes.  He has a 20.00 pack-year smoking history. he has never used smokeless tobacco. He reports that he does not drink alcohol or use drugs.  Current Outpatient Medications on File Prior to Visit  Medication Sig Dispense Refill  . acetaminophen-codeine (TYLENOL #3) 300-30 MG tablet Take 1-2 tablets by mouth every 4 (four) hours as needed for moderate pain. 24 tablet 0  . Ergocalciferol (VITAMIN D2) 2000 units TABS Take 2,000 Units by mouth daily. Reported on 10/18/2015    . Multiple Vitamin (MULTIVITAMIN WITH MINERALS) TABS tablet Take 1 tablet by mouth daily.    Marland Kitchen doxazosin (CARDURA) 4 MG tablet Take 1 tablet (4 mg total) by mouth at bedtime. To help with urine flow and blood pressure (Patient not  taking: Reported on 06/19/2017) 30 tablet 2   No current facility-administered medications on file prior to visit.     ROS Review of Systems  Constitutional: Negative for chills, diaphoresis, fever and unexpected weight change.  HENT: Positive for trouble swallowing (Sometimes food seems to get caught in his throat.). Negative for congestion, hearing loss, rhinorrhea and sore throat.   Eyes: Negative for visual disturbance.  Respiratory: Negative for cough and shortness of breath.   Cardiovascular: Negative for chest pain.  Gastrointestinal: Negative for abdominal pain, constipation and diarrhea.  Genitourinary: Negative for dysuria and flank pain.  Musculoskeletal: Positive for myalgias. Negative for arthralgias and joint swelling.  Skin: Negative for rash.  Neurological: Negative for dizziness and headaches.  Psychiatric/Behavioral: Positive for sleep disturbance (His wife feels that he has sleep apnea because he snores.). Negative for dysphoric mood.    Objective:  BP (!) 151/94   Pulse 86   Temp 98.8 F (37.1 C) (Oral)   Ht 5\' 10"  (1.778 m)   Wt 239 lb (108.4 kg)   BMI 34.29 kg/m   BP Readings from Last 3 Encounters:  06/19/17 (!) 151/94  11/25/16 (!) 165/100  04/22/16 (!) 186/118    Wt Readings from Last 3 Encounters:  06/19/17 239 lb (108.4 kg)  11/25/16 232 lb (105.2 kg)  04/22/16 232 lb (105.2 kg)     Physical Exam  Constitutional: He is oriented to person, place, and time. He appears  well-developed and well-nourished. No distress.  HENT:  Head: Normocephalic and atraumatic.  Right Ear: External ear normal.  Left Ear: External ear normal.  Nose: Nose normal.  Mouth/Throat: Oropharynx is clear and moist.  Eyes: Conjunctivae and EOM are normal. Pupils are equal, round, and reactive to light.  Neck: Normal range of motion. Neck supple. No thyromegaly present.  Cardiovascular: Normal rate, regular rhythm and normal heart sounds.  No murmur  heard. Pulmonary/Chest: Effort normal and breath sounds normal. No respiratory distress. He has no wheezes. He has no rales.  Abdominal: Soft. Bowel sounds are normal. He exhibits no distension. There is no tenderness.  Lymphadenopathy:    He has no cervical adenopathy.  Neurological: He is alert and oriented to person, place, and time. He has normal reflexes.  Skin: Skin is warm and dry.  Psychiatric: He has a normal mood and affect. His behavior is normal. Judgment and thought content normal.      Assessment & Plan:   Kristin was seen today for hypertension.  Diagnoses and all orders for this visit:  Essential hypertension -     Urinalysis, Complete  Smoker  Class 1 obesity due to excess calories without serious comorbidity with body mass index (BMI) of 34.0 to 34.9 in adult  Pharyngoesophageal dysphagia  Other orders -     triamterene-hydrochlorothiazide (MAXZIDE-25) 37.5-25 MG tablet; Take 1 tablet by mouth every morning. -     amLODipine (NORVASC) 10 MG tablet; Take 1 tablet (10 mg total) by mouth daily. For blood pressure   Allergies as of 06/19/2017      Reactions   Poison Sumac Extract Rash      Medication List        Accurate as of 06/19/17  5:22 PM. Always use your most recent med list.          acetaminophen-codeine 300-30 MG tablet Commonly known as:  TYLENOL #3 Take 1-2 tablets by mouth every 4 (four) hours as needed for moderate pain.   amLODipine 10 MG tablet Commonly known as:  NORVASC Take 1 tablet (10 mg total) by mouth daily. For blood pressure   doxazosin 4 MG tablet Commonly known as:  CARDURA Take 1 tablet (4 mg total) by mouth at bedtime. To help with urine flow and blood pressure   multivitamin with minerals Tabs tablet Take 1 tablet by mouth daily.   triamterene-hydrochlorothiazide 37.5-25 MG tablet Commonly known as:  MAXZIDE-25 Take 1 tablet by mouth every morning.   Vitamin D2 2000 units Tabs Take 2,000 Units by mouth daily.  Reported on 10/18/2015       Meds ordered this encounter  Medications  . triamterene-hydrochlorothiazide (MAXZIDE-25) 37.5-25 MG tablet    Sig: Take 1 tablet by mouth every morning.    Dispense:  30 tablet    Refill:  5  . amLODipine (NORVASC) 10 MG tablet    Sig: Take 1 tablet (10 mg total) by mouth daily. For blood pressure    Dispense:  90 tablet    Refill:  1    Patient does have a set up for sleep apnea based on having a short neck and being overweight.  We discussed weight as a low cost method to control his health issues particularly regarding blood pressure and sleep apnea.  We discussed also other benefits of weight loss.  Patient says he will give it a try.  Additionally he is smoking about a pack a day of cigarettes and we discussed both the economic and  health impacts of continuing to smoke.  Currently he cannot afford colonoscopy for cancer screening.  He also cannot afford an EGD to check out the swallowing difficulty.  We discussed a sleep study again he cannot afford that.  I highly recommended that he work on a method of payment for each of these tests and notify me if any of the symptoms progressed.  I will order any of these tests that I recommend at his suggestion/approval.  Follow-up: Return in about 6 months (around 12/17/2017).  Claretta Fraise, M.D.

## 2017-08-31 ENCOUNTER — Telehealth: Payer: Self-pay | Admitting: Family Medicine

## 2017-08-31 DIAGNOSIS — R0683 Snoring: Secondary | ICD-10-CM

## 2017-08-31 NOTE — Telephone Encounter (Signed)
Please advise 

## 2017-09-29 ENCOUNTER — Telehealth: Payer: Self-pay | Admitting: Family Medicine

## 2017-09-30 ENCOUNTER — Other Ambulatory Visit: Payer: Self-pay

## 2017-09-30 DIAGNOSIS — G4730 Sleep apnea, unspecified: Secondary | ICD-10-CM

## 2017-09-30 NOTE — Telephone Encounter (Signed)
Physician put in as referral  For [name] needs to be an order  I corrected that and talked to [facility] and they said they would schedule

## 2017-10-01 ENCOUNTER — Other Ambulatory Visit: Payer: Self-pay | Admitting: Nurse Practitioner

## 2017-10-01 ENCOUNTER — Encounter: Payer: Self-pay | Admitting: Family Medicine

## 2017-10-01 ENCOUNTER — Ambulatory Visit
Admission: RE | Admit: 2017-10-01 | Discharge: 2017-10-01 | Disposition: A | Discharge status: Home / Self Care | Payer: No Typology Code available for payment source | Source: Ambulatory Visit | Attending: Nurse Practitioner | Admitting: Nurse Practitioner

## 2017-10-01 ENCOUNTER — Other Ambulatory Visit: Payer: Self-pay

## 2017-10-01 DIAGNOSIS — W19XXXA Unspecified fall, initial encounter: Secondary | ICD-10-CM

## 2017-10-01 DIAGNOSIS — R52 Pain, unspecified: Secondary | ICD-10-CM

## 2017-10-01 DIAGNOSIS — R609 Edema, unspecified: Secondary | ICD-10-CM

## 2017-10-07 ENCOUNTER — Telehealth: Payer: Self-pay

## 2017-10-07 ENCOUNTER — Other Ambulatory Visit: Payer: Self-pay | Admitting: Family Medicine

## 2017-10-07 DIAGNOSIS — G4730 Sleep apnea, unspecified: Secondary | ICD-10-CM

## 2017-10-07 DIAGNOSIS — R0683 Snoring: Secondary | ICD-10-CM

## 2017-10-07 NOTE — Telephone Encounter (Signed)
Insurance denied sleep study for Gary Ho  They will pay for home sleep study if you put that order in and send to [facility] sleep they can get set up

## 2017-10-12 ENCOUNTER — Other Ambulatory Visit: Payer: Self-pay | Admitting: *Deleted

## 2017-10-12 ENCOUNTER — Telehealth: Payer: Self-pay | Admitting: Family Medicine

## 2017-10-12 DIAGNOSIS — R400 Somnolence: Secondary | ICD-10-CM

## 2017-10-12 DIAGNOSIS — R0681 Apnea, not elsewhere classified: Secondary | ICD-10-CM

## 2017-10-12 DIAGNOSIS — I10 Essential (primary) hypertension: Secondary | ICD-10-CM

## 2017-10-12 DIAGNOSIS — R0683 Snoring: Secondary | ICD-10-CM

## 2017-10-12 NOTE — Progress Notes (Signed)
Patient states that insurance denied sleep study due to lack of clinical documentation. I don't see this documented anywhere other than a telephone call note from the patient. It appears that [facility] attempted to schedule an appointment for him for a sleep consult but that he was unable to work it out with his schedule. He has not been seen by the specialist yet so I'm not sure how it would have even gotten as far as precertification. The preferred location was changed to [facility] last month to see if they could accommodate his schedule but I don't see any scheduling notes from them.   Based on his reported symptoms I believe a consult with a sleep lab physician is his best option for having the sleep study covered. I will reorder the referral to [facility] and include the additional symptoms that he reported to me today.

## 2017-10-12 NOTE — Telephone Encounter (Signed)
Patient aware.

## 2017-10-12 NOTE — Telephone Encounter (Signed)
Please let the pt. know of insurance denial.

## 2017-10-14 ENCOUNTER — Telehealth: Payer: Self-pay | Admitting: Family Medicine

## 2017-10-15 NOTE — Telephone Encounter (Signed)
[facility] to x-ray 

## 2017-10-20 ENCOUNTER — Telehealth: Payer: Self-pay | Admitting: Family Medicine

## 2017-10-21 NOTE — Telephone Encounter (Signed)
I will call patient and tell him that he needs to check that with [employer] because they were the ones that said Home sleep study would be paid for

## 2017-10-23 ENCOUNTER — Ambulatory Visit: Payer: 59 | Attending: Family Medicine

## 2017-10-26 NOTE — Telephone Encounter (Signed)
I gave patient Gary Ho number and told him that [name] is the one that gets it approved

## 2017-10-29 ENCOUNTER — Ambulatory Visit: Payer: 59 | Attending: Family Medicine | Admitting: Neurology

## 2017-10-29 DIAGNOSIS — G4733 Obstructive sleep apnea (adult) (pediatric): Secondary | ICD-10-CM | POA: Diagnosis not present

## 2017-10-29 DIAGNOSIS — R0683 Snoring: Secondary | ICD-10-CM | POA: Diagnosis not present

## 2017-10-29 DIAGNOSIS — G4730 Sleep apnea, unspecified: Secondary | ICD-10-CM

## 2017-11-08 NOTE — Procedures (Signed)
  [place] A. Merlene Laughter, MD     [email or web address]              HOME SLEEP STUDY  LOCATION: [facility]   Patient Name: Gary Ho, Gary Ho Date: 10/29/2017 Gender: Male D.O.B: 03-06-1966 Age (years): 51 Referring Provider: Claretta Fraise Height (inches): 85 Interpreting Physician: Phillips Odor MD, ABSM Weight (lbs): 232 RPSGT: Peak, Robert BMI: 31 MRN: [id number] Neck Size: CLINICAL INFORMATION Sleep Study Type: HST     Indication for sleep study: Snoring     Epworth Sleepiness Score: NA  SLEEP STUDY TECHNIQUE A multi-channel overnight portable sleep study was performed. The channels recorded were: nasal airflow, thoracic respiratory movement, and oxygen saturation with a pulse oximetry. Snoring was also monitored.  MEDICATIONS Patient self administered medications include: N/A.  Current Outpatient Medications:  .  acetaminophen-codeine (TYLENOL #3) 300-30 MG tablet, Take 1-2 tablets by mouth every 4 (four) hours as needed for moderate pain., Disp: 24 tablet, Rfl: 0 .  amLODipine (NORVASC) 10 MG tablet, Take 1 tablet (10 mg total) by mouth daily. For blood pressure, Disp: 90 tablet, Rfl: 1 .  doxazosin (CARDURA) 4 MG tablet, Take 1 tablet (4 mg total) by mouth at bedtime. To help with urine flow and blood pressure (Patient not taking: Reported on 06/19/2017), Disp: 30 tablet, Rfl: 2 .  Ergocalciferol (VITAMIN D2) 2000 units TABS, Take 2,000 Units by mouth daily. Reported on 10/18/2015, Disp: , Rfl:  .  Multiple Vitamin (MULTIVITAMIN WITH MINERALS) TABS tablet, Take 1 tablet by mouth daily., Disp: , Rfl:  .  triamterene-hydrochlorothiazide (MAXZIDE-25) 37.5-25 MG tablet, Take 1 tablet by mouth every morning., Disp: 30 tablet, Rfl: 5   SLEEP ARCHITECTURE Patient was studied for 522 minutes. The sleep efficiency was 97.2 % and the patient was supine for 10.5%. The arousal index was 0.0 per hour.  RESPIRATORY PARAMETERS The overall AHI was 77.4 per  hour, with a central apnea index of 0.9 per hour.  The oxygen nadir was 61% during sleep.     CARDIAC DATA Mean heart rate during sleep was 60.3 bpm.  IMPRESSIONS Severe obstructive sleep apnea occurred during this study (AHI = 77.4/h). AutoPAP 8-14 is suggested.   Delano Metz, MD Diplomate, American Board of Sleep Medicine.  ELECTRONICALLY SIGNED ON:  11/08/2017, 4:49 PM [place] PH: [phone]   FX: [phone] [place]

## 2017-11-11 ENCOUNTER — Telehealth: Payer: Self-pay | Admitting: Family Medicine

## 2017-11-11 DIAGNOSIS — G4730 Sleep apnea, unspecified: Secondary | ICD-10-CM

## 2017-11-12 NOTE — Telephone Encounter (Signed)
Tye [place] do you know how to do these? I have never done this.

## 2017-11-12 NOTE — Telephone Encounter (Signed)
It was read by Dr. Merlene Laughter as positive. He recommends cpap at 7-11.   Nurse: Please help arrange for him to get a cpap device. Thanks, WS

## 2017-11-12 NOTE — Addendum Note (Signed)
Addended by: Antonietta Barcelona D on: 11/12/2017 03:08 PM   Modules accepted: Orders

## 2017-11-16 NOTE — Addendum Note (Signed)
Addended by: Antonietta Barcelona D on: 11/16/2017 04:43 PM   Modules accepted: Orders

## 2017-12-04 DIAGNOSIS — G4733 Obstructive sleep apnea (adult) (pediatric): Secondary | ICD-10-CM | POA: Diagnosis not present

## 2017-12-09 ENCOUNTER — Institutional Professional Consult (permissible substitution): Payer: Self-pay | Admitting: Neurology

## 2017-12-18 ENCOUNTER — Ambulatory Visit: Payer: Self-pay | Admitting: Family Medicine

## 2017-12-19 ENCOUNTER — Other Ambulatory Visit: Payer: Self-pay | Admitting: Family Medicine

## 2017-12-28 ENCOUNTER — Ambulatory Visit: Payer: Self-pay | Admitting: Family Medicine

## 2018-01-04 ENCOUNTER — Encounter: Payer: Self-pay | Admitting: Family Medicine

## 2018-01-04 DIAGNOSIS — G4733 Obstructive sleep apnea (adult) (pediatric): Secondary | ICD-10-CM | POA: Diagnosis not present

## 2018-01-08 ENCOUNTER — Encounter: Payer: Self-pay | Admitting: Family Medicine

## 2018-01-08 ENCOUNTER — Ambulatory Visit: Payer: 59 | Admitting: Family Medicine

## 2018-01-08 VITALS — BP 155/102 | HR 84 | Temp 97.0°F | Ht 70.0 in | Wt 239.0 lb

## 2018-01-08 DIAGNOSIS — L729 Follicular cyst of the skin and subcutaneous tissue, unspecified: Secondary | ICD-10-CM

## 2018-01-08 DIAGNOSIS — K429 Umbilical hernia without obstruction or gangrene: Secondary | ICD-10-CM

## 2018-01-08 DIAGNOSIS — I10 Essential (primary) hypertension: Secondary | ICD-10-CM | POA: Diagnosis not present

## 2018-01-08 MED ORDER — DOXAZOSIN MESYLATE 4 MG PO TABS: 4.0000 mg | ORAL_TABLET | Freq: Every day | ORAL | 0 refills | Status: DC

## 2018-01-09 ENCOUNTER — Encounter: Payer: Self-pay | Admitting: Family Medicine

## 2018-01-09 LAB — CMP14+EGFR
ALK PHOS: 94 IU/L (ref 39–117)
ALT: 34 IU/L (ref 0–44)
AST: 23 IU/L (ref 0–40)
Albumin/Globulin Ratio: 1.7 (ref 1.2–2.2)
Albumin: 4.3 g/dL (ref 3.5–5.5)
BILIRUBIN TOTAL: 0.2 mg/dL (ref 0.0–1.2)
BUN/Creatinine Ratio: 11 (ref 9–20)
BUN: 15 mg/dL (ref 6–24)
CHLORIDE: 101 mmol/L (ref 96–106)
CO2: 23 mmol/L (ref 20–29)
CREATININE: 1.35 mg/dL — AB (ref 0.76–1.27)
Calcium: 9.1 mg/dL (ref 8.7–10.2)
GFR calc Af Amer: 69 mL/min/{1.73_m2} (ref >59)
GFR calc non Af Amer: 60 mL/min/{1.73_m2} (ref >59)
GLOBULIN, TOTAL: 2.6 g/dL (ref 1.5–4.5)
GLUCOSE: 96 mg/dL (ref 65–99)
Potassium: 3.2 mmol/L — ABNORMAL LOW (ref 3.5–5.2)
SODIUM: 142 mmol/L (ref 134–144)
Total Protein: 6.9 g/dL (ref 6.0–8.5)

## 2018-01-09 LAB — CBC WITH DIFFERENTIAL/PLATELET
BASOS ABS: 0.1 10*3/uL (ref 0.0–0.2)
Basos: 1 %
EOS (ABSOLUTE): 0.2 10*3/uL (ref 0.0–0.4)
Eos: 2 %
Hematocrit: 43.9 % (ref 37.5–51.0)
Hemoglobin: 14.7 g/dL (ref 13.0–17.7)
Immature Grans (Abs): 0 10*3/uL (ref 0.0–0.1)
Immature Granulocytes: 0 %
LYMPHS ABS: 2.2 10*3/uL (ref 0.7–3.1)
Lymphs: 23 %
MCH: 28.8 pg (ref 26.6–33.0)
MCHC: 33.5 g/dL (ref 31.5–35.7)
MCV: 86 fL (ref 79–97)
MONOCYTES: 6 %
MONOS ABS: 0.6 10*3/uL (ref 0.1–0.9)
NEUTROS PCT: 68 %
Neutrophils Absolute: 6.3 10*3/uL (ref 1.4–7.0)
Platelets: 277 10*3/uL (ref 150–450)
RBC: 5.11 x10E6/uL (ref 4.14–5.80)
RDW: 15.4 % (ref 12.3–15.4)
WBC: 9.3 10*3/uL (ref 3.4–10.8)

## 2018-01-09 LAB — LIPID PANEL
CHOLESTEROL TOTAL: 194 mg/dL (ref 100–199)
Chol/HDL Ratio: 5.5 ratio — ABNORMAL HIGH (ref 0.0–5.0)
HDL: 35 mg/dL — ABNORMAL LOW (ref >39)
TRIGLYCERIDES: 470 mg/dL — AB (ref 0–149)

## 2018-01-09 NOTE — Progress Notes (Addendum)
Chief Complaint  Patient presents with  . Medical Management of Chronic Issues    HPI  Patient presents today for follow-up of hypertension. Patient has no history of headache chest pain or shortness of breath or recent cough. Patient also denies symptoms of TIA such as numbness weakness lateralizing. Patient checks  blood pressure at home and has not had any elevated readings recently. Patient denies side effects from his medication. States taking it regularly. Also has an umbilical hernia as well as a cyst at the right ventral wrist. Hewould like to have these repaired.  Patient was also recently and sleep lab.  He was found to have significant sleep apnea.  And is in need of a face-to-face visit today to note compliance.  He is noted to be using the machine 7 to 8 hours a night for every night for the last month.  It is helping his energy and feeling of rested state the following morning significantly. PMH: Smoking status noted ROS: Review of Systems  Constitutional: Negative for fever.  Respiratory: Negative for shortness of breath.   Cardiovascular: Negative for chest pain.  Musculoskeletal: Negative for arthralgias.  Skin: Negative for rash.    Objective: BP (!) 155/102   Pulse 84   Temp (!) 97 F (36.1 C) (Oral)   Ht 5' 10" (1.778 m)   Wt 239 lb (108.4 kg)   BMI 34.29 kg/m  Gen: NAD, alert, cooperative with exam HEENT: NCAT, EOMI, PERRL CV: RRR, good S1/S2, no murmur Resp: CTABL, no wheezes, non-labored Abd: SNTND, BS present, no guarding or organomegaly. 2 cm umbilical hernia visible on inspection. Ext: No edema, warm. Raised cystic structure is rubbery. One cm. Located at ventral right wrist Neuro: Alert and oriented, No gross deficits  Assessment and plan:  1. Essential hypertension   2. Umbilical hernia without obstruction and without gangrene   3. Cutaneous cyst     Meds ordered this encounter  Medications  . doxazosin (CARDURA) 4 MG tablet    Sig: Take 1  tablet (4 mg total) by mouth at bedtime.    Dispense:  90 tablet    Refill:  0    Orders Placed This Encounter  Procedures  . CBC with Differential/Platelet  . CMP14+EGFR  . Lipid panel  . Ambulatory referral to General Surgery    Referral Priority:   Routine    Referral Type:   Surgical    Referral Reason:   Specialty Services Required    Requested Specialty:   General Surgery    Number of Visits Requested:   1    Follow up as needed.  Claretta Fraise, MD

## 2018-01-12 LAB — HGB A1C W/O EAG: Hgb A1c MFr Bld: 7.1 % — ABNORMAL HIGH (ref 4.8–5.6)

## 2018-01-12 LAB — SPECIMEN STATUS REPORT

## 2018-01-14 DIAGNOSIS — G4733 Obstructive sleep apnea (adult) (pediatric): Secondary | ICD-10-CM | POA: Diagnosis not present

## 2018-01-15 ENCOUNTER — Ambulatory Visit: Payer: 59 | Admitting: Family Medicine

## 2018-01-15 ENCOUNTER — Encounter: Payer: Self-pay | Admitting: Family Medicine

## 2018-01-15 ENCOUNTER — Telehealth: Payer: Self-pay | Admitting: Family Medicine

## 2018-01-15 ENCOUNTER — Other Ambulatory Visit: Payer: Self-pay | Admitting: Family Medicine

## 2018-01-15 VITALS — BP 138/89 | HR 83 | Temp 98.3°F | Ht 70.0 in | Wt 237.4 lb

## 2018-01-15 DIAGNOSIS — E119 Type 2 diabetes mellitus without complications: Secondary | ICD-10-CM | POA: Diagnosis not present

## 2018-01-15 MED ORDER — BLOOD GLUCOSE MONITOR KIT: PACK | 0 refills | Status: DC

## 2018-01-15 MED ORDER — DOXAZOSIN MESYLATE 4 MG PO TABS: 4.0000 mg | ORAL_TABLET | Freq: Every day | ORAL | 0 refills | Status: DC

## 2018-01-15 MED ORDER — ATORVASTATIN CALCIUM 40 MG PO TABS: 40.0000 mg | ORAL_TABLET | Freq: Every day | ORAL | 3 refills | Status: DC

## 2018-01-15 MED ORDER — VALSARTAN 160 MG PO TABS: 160.0000 mg | ORAL_TABLET | Freq: Every day | ORAL | 1 refills | Status: DC

## 2018-01-15 MED ORDER — TRIAMTERENE-HCTZ 37.5-25 MG PO TABS: ORAL_TABLET | ORAL | 1 refills | Status: AC

## 2018-01-15 MED ORDER — TRIAMTERENE-HCTZ 37.5-25 MG PO TABS: ORAL_TABLET | ORAL | 1 refills | Status: DC

## 2018-01-15 MED ORDER — AMLODIPINE BESYLATE 10 MG PO TABS: ORAL_TABLET | ORAL | 1 refills | Status: DC

## 2018-01-15 MED ORDER — METFORMIN HCL ER 500 MG PO TB24: 500.0000 mg | ORAL_TABLET | Freq: Every day | ORAL | 1 refills | Status: DC

## 2018-01-15 MED ORDER — VARENICLINE TARTRATE 0.5 MG X 11 & 1 MG X 42 PO MISC: ORAL | 0 refills | Status: DC

## 2018-01-15 NOTE — Telephone Encounter (Signed)
Refill sent pt aware. 

## 2018-01-15 NOTE — Patient Instructions (Signed)
Carbohydrate Counting for Diabetes Mellitus, Adult Carbohydrate counting is a method for keeping track of how many carbohydrates you eat. Eating carbohydrates naturally increases the amount of sugar (glucose) in the blood. Counting how many carbohydrates you eat helps keep your blood glucose within normal limits, which helps you manage your diabetes (diabetes mellitus). It is important to know how many carbohydrates you can safely have in each meal. This is different for every person. A diet and nutrition specialist (registered dietitian) can help you make a meal plan and calculate how many carbohydrates you should have at each meal and snack. Carbohydrates are found in the following foods:  Grains, such as breads and cereals.  Dried beans and soy products.  Starchy vegetables, such as potatoes, peas, and corn.  Fruit and fruit juices.  Milk and yogurt.  Sweets and snack foods, such as cake, cookies, candy, chips, and soft drinks.  How do I count carbohydrates? There are two ways to count carbohydrates in food. You can use either of the methods or a combination of both. Reading "Nutrition Facts" on packaged food The "Nutrition Facts" list is included on the labels of almost all packaged foods and beverages in the [place] It includes:  The serving size.  Information about nutrients in each serving, including the grams (g) of carbohydrate per serving.  To use the "Nutrition Facts":  Decide how many servings you will have.  Multiply the number of servings by the number of carbohydrates per serving.  The resulting number is the total amount of carbohydrates that you will be having.  Learning standard serving sizes of other foods When you eat foods containing carbohydrates that are not packaged or do not include "Nutrition Facts" on the label, you need to measure the servings in order to count the amount of carbohydrates:  Measure the foods that you will eat with a food scale or  measuring cup, if needed.  Decide how many standard-size servings you will eat.  Multiply the number of servings by 15. Most carbohydrate-rich foods have about 15 g of carbohydrates per serving. ? For example, if you eat 8 oz (170 g) of strawberries, you will have eaten 2 servings and 30 g of carbohydrates (2 servings x 15 g = 30 g).  For foods that have more than one food mixed, such as soups and casseroles, you must count the carbohydrates in each food that is included.  The following list contains standard serving sizes of common carbohydrate-rich foods. Each of these servings has about 15 g of carbohydrates:   hamburger bun or  English muffin.   oz (15 mL) syrup.   oz (14 g) jelly.  1 slice of bread.  1 six-inch tortilla.  3 oz (85 g) cooked rice or pasta.  4 oz (113 g) cooked dried beans.  4 oz (113 g) starchy vegetable, such as peas, corn, or potatoes.  4 oz (113 g) hot cereal.  4 oz (113 g) mashed potatoes or  of a large baked potato.  4 oz (113 g) canned or frozen fruit.  4 oz (120 mL) fruit juice.  4-6 crackers.  6 chicken nuggets.  6 oz (170 g) unsweetened dry cereal.  6 oz (170 g) plain fat-free yogurt or yogurt sweetened with artificial sweeteners.  8 oz (240 mL) milk.  8 oz (170 g) fresh fruit or one small piece of fruit.  24 oz (680 g) popped popcorn.  Example of carbohydrate counting Sample meal  3 oz (85 g) chicken breast.    6 oz (170 g) brown rice.  4 oz (113 g) corn.  8 oz (240 mL) milk.  8 oz (170 g) strawberries with sugar-free whipped topping. Carbohydrate calculation 1. Identify the foods that contain carbohydrates: ? Rice. ? Corn. ? Milk. ? Strawberries. 2. Calculate how many servings you have of each food: ? 2 servings rice. ? 1 serving corn. ? 1 serving milk. ? 1 serving strawberries. 3. Multiply each number of servings by 15 g: ? 2 servings rice x 15 g = 30 g. ? 1 serving corn x 15 g = 15 g. ? 1 serving milk x 15  g = 15 g. ? 1 serving strawberries x 15 g = 15 g. 4. Add together all of the amounts to find the total grams of carbohydrates eaten: ? 30 g + 15 g + 15 g + 15 g = 75 g of carbohydrates total.  Diabetes Mellitus and Exercise Exercising regularly is important for your overall health, especially when you have diabetes (diabetes mellitus). Exercising is not only about losing weight. It has many health benefits, such as increasing muscle strength and bone density and reducing body fat and stress. This leads to improved fitness, flexibility, and endurance, all of which result in better overall health. Exercise has additional benefits for people with diabetes, including: Reducing appetite. Helping to lower and control blood glucose. Lowering blood pressure. Helping to control amounts of fatty substances (lipids) in the blood, such as cholesterol and triglycerides. Helping the body to respond better to insulin (improving insulin sensitivity). Reducing how much insulin the body needs. Decreasing the risk for heart disease by: Lowering cholesterol and triglyceride levels. Increasing the levels of good cholesterol. Lowering blood glucose levels.  What is my activity plan? Your health care provider or certified diabetes educator can help you make a plan for the type and frequency of exercise (activity plan) that works for you. Make sure that you: Do at least 150 minutes of moderate-intensity or vigorous-intensity exercise each week. This could be brisk walking, biking, or water aerobics. Do stretching and strength exercises, such as yoga or weightlifting, at least 2 times a week. Spread out your activity over at least 3 days of the week. Get some form of physical activity every day. Do not go more than 2 days in a row without some kind of physical activity. Avoid being inactive for more than 90 minutes at a time. Take frequent breaks to walk or stretch. Choose a type of exercise or activity that you  enjoy, and set realistic goals. Start slowly, and gradually increase the intensity of your exercise over time.  What do I need to know about managing my diabetes? Check your blood glucose before and after exercising. If your blood glucose is higher than 240 mg/dL (13.3 mmol/L) before you exercise, check your urine for ketones. If you have ketones in your urine, do not exercise until your blood glucose returns to normal. Know the symptoms of low blood glucose (hypoglycemia) and how to treat it. Your risk for hypoglycemia increases during and after exercise. Common symptoms of hypoglycemia can include: Hunger. Anxiety. Sweating and feeling clammy. Confusion. Dizziness or feeling light-headed. Increased heart rate or palpitations. Blurry vision. Tingling or numbness around the mouth, lips, or tongue. Tremors or shakes. Irritability. Keep a rapid-acting carbohydrate snack available before, during, and after exercise to help prevent or treat hypoglycemia. Avoid injecting insulin into areas of the body that are going to be exercised. For example, avoid injecting insulin into: The  arms, when playing tennis. The legs, when jogging. Keep records of your exercise habits. Doing this can help you and your health care provider adjust your diabetes management plan as needed. Write down: Food that you eat before and after you exercise. Blood glucose levels before and after you exercise. The type and amount of exercise you have done. When your insulin is expected to peak, if you use insulin. Avoid exercising at times when your insulin is peaking. When you start a new exercise or activity, work with your health care provider to make sure the activity is safe for you, and to adjust your insulin, medicines, or food intake as needed. Drink plenty of water while you exercise to prevent dehydration or heat stroke. Drink enough fluid to keep your urine clear or pale yellow. This information is not intended to  replace advice given to you by your health care provider. Make sure you discuss any questions you have with your health care provider. Document Released: 07/12/2003 Document Revised: 11/09/2015 Document Reviewed: 10/01/2015 Elsevier Interactive Patient Education  2018 [employer].

## 2018-01-15 NOTE — Progress Notes (Signed)
Chief Complaint  Patient presents with  . Results    HPI  Patient presents today for consultation regarding recently diagnosed diabetes mellitus.  Patient had had a couple of elevated blood sugars noted on random checks in upper locations.  He has not had any symptoms such as polyuria polydipsia excessive hunger nausea etc.  He is in today due to elevated hemoglobin A1c on recent check.  He is here to establish a diabetic regimen and for education regarding diabetic diet exercise and general care.  PMH: Smoking status noted ROS: Per HPI  Objective: BP 138/89   Pulse 83   Temp 98.3 F (36.8 C) (Oral)   Ht _0  (1.778 m)   Wt 237 lb 6 oz (107.7 kg)   BMI 34.06 kg/m  Gen: NAD, alert, cooperative  HEENT: NCAT, EOMI, PERRL Ext: No edema, warm Neuro: Alert and oriented, No gross deficits  Assessment and plan:  1. Diabetes mellitus, new onset ([place])     Meds ordered this encounter  Medications  . blood glucose meter kit and supplies KIT    Sig: Dispense based on patient and insurance preference. Use up to four times daily as directed. (FOR ICD-9 250.00, 250.01).    Dispense:  1 each    Refill:  0    Order Specific Question:   Number of strips    Answer:   100    Order Specific Question:   Number of lancets    Answer:   100  . DISCONTD: amLODipine (NORVASC) 10 MG tablet    Sig: TAKE 1 TABLET BY MOUTH ONCE DAILY FOR BLOOD PRESSURE    Dispense:  90 tablet    Refill:  1  . doxazosin (CARDURA) 4 MG tablet    Sig: Take 1 tablet (4 mg total) by mouth at bedtime.    Dispense:  90 tablet    Refill:  0  . DISCONTD: triamterene-hydrochlorothiazide (MAXZIDE-25) 37.5-25 MG tablet    Sig: TAKE 1 TABLET BY MOUTH ONCE DAILY EVERY MORNING    Dispense:  90 tablet    Refill:  1    Please consider 90 day supplies to promote better adherence  . metFORMIN (GLUCOPHAGE XR) 500 MG 24 hr tablet    Sig: Take 1 tablet (500 mg total) by mouth daily with breakfast.    Dispense:  90 tablet   Refill:  1  . atorvastatin (LIPITOR) 40 MG tablet    Sig: Take 1 tablet (40 mg total) by mouth daily.    Dispense:  90 tablet    Refill:  3  . valsartan (DIOVAN) 160 MG tablet    Sig: Take 1 tablet (160 mg total) by mouth daily.    Dispense:  90 tablet    Refill:  1  . varenicline (CHANTIX STARTING MONTH PAK) 0.5 MG X 11 & 1 MG X 42 tablet    Sig: Take as directed.    Dispense:  53 tablet    Refill:  0    30 minutes was spent with the patient more than half of which was spent in consultation regarding the patient is a the new diagnosis of diabetes particularly when combined with his other risk factors which include smoking, elevated cholesterol, obesity, hypertension.  We discussed diabetic diet.  The need for regular exercise.  The need to monitor his blood sugars twice daily fasting and postprandial.  He was encouraged to bring those readings with him on a log sheet when he returns.  We discussed the  need to take a statin to prevent heart attack and stroke and an ACE/are in order to promote better circulation to the kidney and prevent diabetic renal disease.  Follow up 6 weeks Claretta Fraise, MD

## 2018-01-31 ENCOUNTER — Other Ambulatory Visit: Payer: Self-pay

## 2018-01-31 ENCOUNTER — Encounter (HOSPITAL_COMMUNITY): Payer: Self-pay | Admitting: Emergency Medicine

## 2018-01-31 ENCOUNTER — Emergency Department (HOSPITAL_COMMUNITY): Payer: 59

## 2018-01-31 ENCOUNTER — Emergency Department (HOSPITAL_COMMUNITY)
Admission: EM | Admit: 2018-01-31 | Discharge: 2018-01-31 | Disposition: A | Discharge status: Home / Self Care | Payer: 59 | Attending: Emergency Medicine | Admitting: Emergency Medicine

## 2018-01-31 DIAGNOSIS — Z79899 Other long term (current) drug therapy: Secondary | ICD-10-CM | POA: Insufficient documentation

## 2018-01-31 DIAGNOSIS — F17210 Nicotine dependence, cigarettes, uncomplicated: Secondary | ICD-10-CM | POA: Insufficient documentation

## 2018-01-31 DIAGNOSIS — I10 Essential (primary) hypertension: Secondary | ICD-10-CM | POA: Diagnosis not present

## 2018-01-31 DIAGNOSIS — E119 Type 2 diabetes mellitus without complications: Secondary | ICD-10-CM | POA: Diagnosis not present

## 2018-01-31 DIAGNOSIS — R42 Dizziness and giddiness: Secondary | ICD-10-CM

## 2018-01-31 DIAGNOSIS — Z7984 Long term (current) use of oral hypoglycemic drugs: Secondary | ICD-10-CM | POA: Insufficient documentation

## 2018-01-31 DIAGNOSIS — R112 Nausea with vomiting, unspecified: Secondary | ICD-10-CM | POA: Diagnosis not present

## 2018-01-31 HISTORY — DX: Sleep apnea, unspecified: G47.30

## 2018-01-31 HISTORY — DX: Type 2 diabetes mellitus without complications: E11.9

## 2018-01-31 LAB — CBG MONITORING, ED: Glucose-Capillary: 101 mg/dL — ABNORMAL HIGH (ref 70–99)

## 2018-01-31 LAB — URINALYSIS, ROUTINE W REFLEX MICROSCOPIC
BACTERIA UA: NONE SEEN
Bilirubin Urine: NEGATIVE
KETONES UR: NEGATIVE mg/dL
LEUKOCYTES UA: NEGATIVE
NITRITE: NEGATIVE
PH: 7 (ref 5.0–8.0)
PROTEIN: 100 mg/dL — AB
Specific Gravity, Urine: 1.013 (ref 1.005–1.030)

## 2018-01-31 LAB — CBC
HEMATOCRIT: 45.6 % (ref 39.0–52.0)
Hemoglobin: 14.9 g/dL (ref 13.0–17.0)
MCH: 28.8 pg (ref 26.0–34.0)
MCHC: 32.7 g/dL (ref 30.0–36.0)
MCV: 88 fL (ref 78.0–100.0)
PLATELETS: 280 10*3/uL (ref 150–400)
RBC: 5.18 MIL/uL (ref 4.22–5.81)
RDW: 14.4 % (ref 11.5–15.5)
WBC: 11.1 10*3/uL — ABNORMAL HIGH (ref 4.0–10.5)

## 2018-01-31 LAB — BASIC METABOLIC PANEL
Anion gap: 10 (ref 5–15)
BUN: 15 mg/dL (ref 6–20)
CHLORIDE: 102 mmol/L (ref 98–111)
CO2: 30 mmol/L (ref 22–32)
Calcium: 9.6 mg/dL (ref 8.9–10.3)
Creatinine, Ser: 1.39 mg/dL — ABNORMAL HIGH (ref 0.61–1.24)
GFR, EST NON AFRICAN AMERICAN: 57 mL/min — AB (ref >60)
Glucose, Bld: 107 mg/dL — ABNORMAL HIGH (ref 70–99)
POTASSIUM: 3.4 mmol/L — AB (ref 3.5–5.1)
Sodium: 142 mmol/L (ref 135–145)

## 2018-01-31 LAB — TROPONIN I: Troponin I: <0.03 ng/mL (ref <0.03)

## 2018-01-31 MED ORDER — LORAZEPAM 2 MG/ML IJ SOLN
0.5000 mg | Freq: Once | INTRAMUSCULAR | Status: AC
  Administered 2018-01-31: 0.5 mg via INTRAVENOUS
  Filled 2018-01-31: qty 1

## 2018-01-31 MED ORDER — LORAZEPAM 0.5 MG PO TABS: 0.5000 mg | ORAL_TABLET | Freq: Four times a day (QID) | ORAL | 0 refills | Status: DC | PRN

## 2018-01-31 MED ORDER — ONDANSETRON 4 MG PO TBDP
4.0000 mg | ORAL_TABLET | Freq: Once | ORAL | Status: AC
  Administered 2018-01-31: 4 mg via ORAL
  Filled 2018-01-31: qty 1

## 2018-01-31 MED ORDER — SODIUM CHLORIDE 0.9 % IV BOLUS
500.0000 mL | Freq: Once | INTRAVENOUS | Status: AC
  Administered 2018-01-31: 500 mL via INTRAVENOUS

## 2018-01-31 MED ORDER — ONDANSETRON HCL 8 MG PO TABS: 8.0000 mg | ORAL_TABLET | Freq: Three times a day (TID) | ORAL | 0 refills | Status: DC | PRN

## 2018-01-31 NOTE — Discharge Instructions (Signed)
There was no clear cause for the vertigo.  We are prescribing medicine to treat the discomfort.  Continue taking your medication for diabetes and hypertension.  Follow-up with your primary care doctor for checkup in 2 or 3 days, to ensure that you are getting better.  Do not drive when taking the lorazepam, or if you are dizzy.  Return here, if needed, for problems.

## 2018-01-31 NOTE — ED Triage Notes (Signed)
Patient c/o extreme dizziness with nausea and vomiting x2 days. Dizziness constantly but worse with movement. Denies any chest pain, facial drooping, slurred speech, headache, or weakness on a certain side. Patient newly diagnosed diabetic patient (past 90 days). Patient started on metformin and "fluid pill" last Saturday. Per patient stopped taking them last night. Denies any diarrhea related to metformin.

## 2018-01-31 NOTE — ED Provider Notes (Signed)
[facility] EMERGENCY DEPARTMENT Provider Note   CSN: [id number] Arrival date & time: 01/31/18  1507     History   Chief Complaint Chief Complaint  Patient presents with  . Dizziness    HPI Gary Ho is a 52 y.o. male.  HPI   He presents for evaluation of dizziness associated with nausea and vomiting.  The dizziness is provoked by movement.  He is recently been started on medications for diabetes including metformin and an diuretic for blood pressure elevation.  He was prescribed valsartan but has not started taking it yet.  He describes the dizziness as a spinning sensation that is worse with head movement, eye movement, and standing or walking.  No prior history of vertigo.  He denies headache, chest pain, focal weakness or paresthesia.  He continues to smoke cigarettes.  There are no other known modifying factors.  Past Medical History:  Diagnosis Date  . Diabetes mellitus without complication ([place])   . Hypertension   . Sleep apnea     Patient Active Problem List   Diagnosis Date Noted  . BPH (benign prostatic hyperplasia) 01/25/2016  . Excessive daytime sleepiness 01/25/2016  . AKI (acute kidney injury) ([place]) 10/24/2015  . HTN (hypertension) 10/24/2015    History reviewed. No pertinent surgical history.      Home Medications    Prior to Admission medications   Medication Sig Start Date End Date Taking? Authorizing Provider  amLODipine (NORVASC) 10 MG tablet TAKE 1 TABLET BY MOUTH ONCE DAILY FOR BLOOD PRESSURE 01/15/18   Claretta Fraise, MD  atorvastatin (LIPITOR) 40 MG tablet Take 1 tablet (40 mg total) by mouth daily. 01/15/18   Claretta Fraise, MD  blood glucose meter kit and supplies KIT Dispense based on patient and insurance preference. Use up to four times daily as directed. (FOR ICD-9 250.00, 250.01). 01/15/18   Claretta Fraise, MD  doxazosin (CARDURA) 4 MG tablet Take 1 tablet (4 mg total) by mouth at bedtime. 01/15/18   Claretta Fraise, MD  LORazepam  (ATIVAN) 0.5 MG tablet Take 1 tablet (0.5 mg total) by mouth every 6 (six) hours as needed (Dizziness). 01/31/18   Daleen Bo, MD  metFORMIN (GLUCOPHAGE XR) 500 MG 24 hr tablet Take 1 tablet (500 mg total) by mouth daily with breakfast. 01/15/18   Claretta Fraise, MD  ondansetron (ZOFRAN) 8 MG tablet Take 1 tablet (8 mg total) by mouth every 8 (eight) hours as needed for nausea or vomiting. 01/31/18   Daleen Bo, MD  triamterene-hydrochlorothiazide (MAXZIDE-25) 37.5-25 MG tablet TAKE 1 TABLET BY MOUTH ONCE DAILY EVERY MORNING 01/15/18   Claretta Fraise, MD  valsartan (DIOVAN) 160 MG tablet Take 1 tablet (160 mg total) by mouth daily. 01/15/18   Claretta Fraise, MD  varenicline (CHANTIX STARTING MONTH PAK) 0.5 MG X 11 & 1 MG X 42 tablet Take as directed. 01/15/18   Claretta Fraise, MD    Family History No family history on file.  Social History Social History   Tobacco Use  . Smoking status: Current Every Day Smoker    Packs/day: 1.00    Years: 20.00    Pack years: 20.00    Types: Cigarettes  . Smokeless tobacco: Never Used  Substance Use Topics  . Alcohol use: No  . Drug use: No     Allergies   Poison sumac extract   Review of Systems Review of Systems  All other systems reviewed and are negative.    Physical Exam Updated Vital Signs BP Marland Kitchen)  146/97   Pulse 64   Temp 98.2 F (36.8 C) (Oral)   Resp 12   Ht 5' 10"  (1.778 m)   Wt 106.6 kg   SpO2 97%   BMI 33.72 kg/m   Physical Exam  Constitutional: He is oriented to person, place, and time. He appears well-developed and well-nourished.  HENT:  Head: Normocephalic and atraumatic.  Right Ear: External ear normal.  Left Ear: External ear normal.  Eyes: Pupils are equal, round, and reactive to light. Conjunctivae and EOM are normal.  Neck: Normal range of motion and phonation normal. Neck supple.  Cardiovascular: Normal rate, regular rhythm and normal heart sounds.  Pulmonary/Chest: Effort normal and breath sounds  normal. He exhibits no bony tenderness.  Abdominal: Soft. There is no tenderness.  Musculoskeletal: Normal range of motion.  Neurological: He is alert and oriented to person, place, and time. No cranial nerve deficit or sensory deficit. He exhibits normal muscle tone. Coordination normal.  No dysarthria or aphasia.  Left beating nystagmus present which extinguishes quickly.  No ataxia.  Normal finger-to-nose bilaterally.  Skin: Skin is warm, dry and intact.  Psychiatric: He has a normal mood and affect. His behavior is normal. Judgment and thought content normal.  Nursing note and vitals reviewed.    ED Treatments / Results  Labs (all labs ordered are listed, but only abnormal results are displayed) Labs Reviewed  BASIC METABOLIC PANEL - Abnormal; Notable for the following components:      Result Value   Potassium 3.4 (*)    Glucose, Bld 107 (*)    Creatinine, Ser 1.39 (*)    GFR calc non Af Amer 57 (*)    All other components within normal limits  CBC - Abnormal; Notable for the following components:   WBC 11.1 (*)    All other components within normal limits  URINALYSIS, ROUTINE W REFLEX MICROSCOPIC - Abnormal; Notable for the following components:   Glucose, UA >=500 (*)    Hgb urine dipstick SMALL (*)    Protein, ur 100 (*)    All other components within normal limits  CBG MONITORING, ED - Abnormal; Notable for the following components:   Glucose-Capillary 101 (*)    All other components within normal limits  TROPONIN I  CBG MONITORING, ED    EKG EKG Interpretation  Date/Time:  Sunday January 31 2018 15:52:50 EDT Ventricular Rate:  74 PR Interval:  152 QRS Duration: 98 QT Interval:  390 QTC Calculation: 432 R Axis:   52 Text Interpretation:  Normal sinus rhythm Minimal voltage criteria for LVH, may be normal variant Borderline ECG since last tracing no significant change Confirmed by Daleen Bo [phone]) on 01/31/2018 4:11:29 PM   Radiology Ct Head Wo  Contrast  Result Date: 01/31/2018 CLINICAL DATA:  Dizziness with nausea and vomiting. EXAM: CT HEAD WITHOUT CONTRAST TECHNIQUE: Contiguous axial images were obtained from the base of the skull through the vertex without intravenous contrast. COMPARISON:  July 06, 2014 FINDINGS: Brain: No evidence of acute infarction, hemorrhage, hydrocephalus, extra-axial collection or mass lesion/mass effect. Vascular: No hyperdense vessel or unexpected calcification. Skull: Normal. Negative for fracture or focal lesion. Sinuses/Orbits: No acute finding. Other: None. IMPRESSION: No cause for the patient's symptoms identified. Electronically Signed   By: Dorise Bullion III M.D   On: 01/31/2018 16:59    Procedures Procedures (including critical care time)  Medications Ordered in ED Medications  ondansetron (ZOFRAN-ODT) disintegrating tablet 4 mg (4 mg Oral Given 01/31/18 1606)  sodium  chloride 0.9 % bolus 500 mL (0 mLs Intravenous Stopped 01/31/18 1841)  LORazepam (ATIVAN) injection 0.5 mg (0.5 mg Intravenous Given 01/31/18 1730)     Initial Impression / Assessment and Plan / ED Course  I have reviewed the triage vital signs and the nursing notes.  Pertinent labs & imaging results that were available during my care of the patient were reviewed by me and considered in my medical decision making (see chart for details).  Clinical Course as of Jan 31 2125  Nancy Fetter Jan 31, 2018  1827 Normal except elevated glucose, hemoglobin and protein  Urinalysis, Routine w reflex microscopic(!) [EW]  1827 Normal  Troponin I [EW]  1828 Slight elevation  CBG monitoring, ED(!) [EW]  1828 Normal except potassium low, glucose high, creatinine high  Basic metabolic panel(!) [EW]  7510 Normal except white count high  CBC(!) [EW]    Clinical Course User Index [EW] Daleen Bo, MD     Patient Vitals for the past 24 hrs:  BP Temp Temp src Pulse Resp SpO2 Height Weight  01/31/18 1930 (!) 146/97 - - 64 12 97 % - -  01/31/18  1900 (!) 152/90 - - 64 13 96 % - -  01/31/18 1830 (!) 145/99 - - - 17 - - -  01/31/18 1800 124/89 - - 60 18 96 % - -  01/31/18 1730 (!) 191/118 - - 71 15 100 % - -  01/31/18 1725 (!) 174/125 - - 74 15 95 % - -  01/31/18 1724 - - - 73 11 98 % - -  01/31/18 1723 (!) 163/110 - - 73 (!) 21 96 % - -  01/31/18 1722 - - - 62 12 94 % - -  01/31/18 1721 (!) 144/99 - - 64 15 96 % - -  01/31/18 1719 - - - 62 13 97 % - -  01/31/18 1718 (!) 151/97 - - - 13 - - -  01/31/18 1700 (!) 149/96 - - (!) 57 - 95 % - -  01/31/18 1600 - - - - - - 5' 10"  (1.778 m) 106.6 kg  01/31/18 1559 111/87 98.2 F (36.8 C) Oral 74 20 96 % - -    7:25 PM Reevaluation with update and discussion. After initial assessment and treatment, an updated evaluation reveals patient states he feels better at this time after the medication and fluids were given.  Findings discussed with the patient and his wife, all questions were answered. Daleen Bo   Medical Decision Making: Nonspecific vertigo with hypertension.  Doubt acute CVA, metabolic instability or impending vascular collapse.  Mild glucose elevation, currently under treatment.  No indication for hospitalization or change in treatment plan at this time.  CRITICAL CARE-no Performed by: Daleen Bo  Nursing Notes Reviewed/ Care Coordinated Applicable Imaging Reviewed Interpretation of Laboratory Data incorporated into ED treatment  The patient appears reasonably screened and/or stabilized for discharge and I doubt any other medical condition or other [facility] requiring further screening, evaluation, or treatment in the ED at this time prior to discharge.  Plan: Home Medications-continue current medications; Home Treatments-work release for 3 days, gradually advance diet; return here if the recommended treatment, does not improve the symptoms; Recommended follow up-he is to be checkup 3 days and as needed.   Final Clinical Impressions(s) / ED Diagnoses   Final diagnoses:    Dizziness  Hypertension, unspecified type    ED Discharge Orders         Ordered  ondansetron (ZOFRAN) 8 MG tablet  Every 8 hours PRN     01/31/18 1932    LORazepam (ATIVAN) 0.5 MG tablet  Every 6 hours PRN     01/31/18 1932           Daleen Bo, MD 01/31/18 2127

## 2018-02-03 DIAGNOSIS — G4733 Obstructive sleep apnea (adult) (pediatric): Secondary | ICD-10-CM | POA: Diagnosis not present

## 2018-03-05 ENCOUNTER — Ambulatory Visit: Payer: 59 | Admitting: Family Medicine

## 2018-03-05 DIAGNOSIS — Z0000 Encounter for general adult medical examination without abnormal findings: Secondary | ICD-10-CM | POA: Diagnosis not present

## 2018-03-06 DIAGNOSIS — G4733 Obstructive sleep apnea (adult) (pediatric): Secondary | ICD-10-CM | POA: Diagnosis not present

## 2018-03-12 DIAGNOSIS — I10 Essential (primary) hypertension: Secondary | ICD-10-CM | POA: Diagnosis not present

## 2018-03-12 DIAGNOSIS — E1129 Type 2 diabetes mellitus with other diabetic kidney complication: Secondary | ICD-10-CM | POA: Diagnosis not present

## 2018-03-12 DIAGNOSIS — Z6833 Body mass index (BMI) 33.0-33.9, adult: Secondary | ICD-10-CM | POA: Diagnosis not present

## 2018-03-30 ENCOUNTER — Telehealth: Payer: Self-pay | Admitting: *Deleted

## 2018-04-05 DIAGNOSIS — G4733 Obstructive sleep apnea (adult) (pediatric): Secondary | ICD-10-CM | POA: Diagnosis not present

## 2018-05-06 DIAGNOSIS — G4733 Obstructive sleep apnea (adult) (pediatric): Secondary | ICD-10-CM | POA: Diagnosis not present

## 2018-05-06 NOTE — Telephone Encounter (Signed)
lmtcb for flu shot 

## 2018-05-07 DIAGNOSIS — E785 Hyperlipidemia, unspecified: Secondary | ICD-10-CM | POA: Diagnosis not present

## 2018-05-07 DIAGNOSIS — E1129 Type 2 diabetes mellitus with other diabetic kidney complication: Secondary | ICD-10-CM | POA: Diagnosis not present

## 2018-05-07 DIAGNOSIS — Z79899 Other long term (current) drug therapy: Secondary | ICD-10-CM | POA: Diagnosis not present

## 2018-05-07 DIAGNOSIS — I10 Essential (primary) hypertension: Secondary | ICD-10-CM | POA: Diagnosis not present

## 2018-05-14 DIAGNOSIS — E1129 Type 2 diabetes mellitus with other diabetic kidney complication: Secondary | ICD-10-CM | POA: Diagnosis not present

## 2018-05-14 DIAGNOSIS — E785 Hyperlipidemia, unspecified: Secondary | ICD-10-CM | POA: Diagnosis not present

## 2018-06-04 DIAGNOSIS — X32XXXA Exposure to sunlight, initial encounter: Secondary | ICD-10-CM | POA: Diagnosis not present

## 2018-06-04 DIAGNOSIS — D225 Melanocytic nevi of trunk: Secondary | ICD-10-CM | POA: Diagnosis not present

## 2018-06-04 DIAGNOSIS — L570 Actinic keratosis: Secondary | ICD-10-CM | POA: Diagnosis not present

## 2018-06-06 DIAGNOSIS — G4733 Obstructive sleep apnea (adult) (pediatric): Secondary | ICD-10-CM | POA: Diagnosis not present

## 2018-07-05 DIAGNOSIS — G4733 Obstructive sleep apnea (adult) (pediatric): Secondary | ICD-10-CM | POA: Diagnosis not present

## 2018-07-09 ENCOUNTER — Ambulatory Visit: Payer: 59 | Admitting: Family Medicine

## 2018-08-05 DIAGNOSIS — G4733 Obstructive sleep apnea (adult) (pediatric): Secondary | ICD-10-CM | POA: Diagnosis not present

## 2018-09-04 DIAGNOSIS — G4733 Obstructive sleep apnea (adult) (pediatric): Secondary | ICD-10-CM | POA: Diagnosis not present

## 2018-10-01 ENCOUNTER — Other Ambulatory Visit (HOSPITAL_COMMUNITY): Payer: Self-pay | Admitting: Urology

## 2018-10-01 ENCOUNTER — Ambulatory Visit (INDEPENDENT_AMBULATORY_CARE_PROVIDER_SITE_OTHER): Payer: 59 | Admitting: Urology

## 2018-10-01 DIAGNOSIS — R972 Elevated prostate specific antigen [PSA]: Secondary | ICD-10-CM | POA: Diagnosis not present

## 2018-10-01 DIAGNOSIS — N5201 Erectile dysfunction due to arterial insufficiency: Secondary | ICD-10-CM | POA: Diagnosis not present

## 2018-10-01 DIAGNOSIS — R3911 Hesitancy of micturition: Secondary | ICD-10-CM

## 2018-10-01 DIAGNOSIS — N401 Enlarged prostate with lower urinary tract symptoms: Secondary | ICD-10-CM | POA: Diagnosis not present

## 2018-10-15 ENCOUNTER — Encounter (HOSPITAL_COMMUNITY): Payer: Self-pay

## 2018-10-15 ENCOUNTER — Ambulatory Visit (HOSPITAL_COMMUNITY): Payer: 59

## 2018-12-31 ENCOUNTER — Ambulatory Visit (HOSPITAL_COMMUNITY): Payer: 59

## 2019-01-14 ENCOUNTER — Ambulatory Visit (HOSPITAL_COMMUNITY)
Admission: RE | Admit: 2019-01-14 | Discharge: 2019-01-14 | Disposition: A | Discharge status: Home / Self Care | Payer: 59 | Source: Ambulatory Visit | Attending: Internal Medicine | Admitting: Internal Medicine

## 2019-01-14 ENCOUNTER — Ambulatory Visit: Payer: 59 | Admitting: Urology

## 2019-01-14 ENCOUNTER — Other Ambulatory Visit: Payer: Self-pay

## 2019-01-14 ENCOUNTER — Other Ambulatory Visit (HOSPITAL_COMMUNITY): Payer: Self-pay | Admitting: Internal Medicine

## 2019-01-14 DIAGNOSIS — R011 Cardiac murmur, unspecified: Secondary | ICD-10-CM

## 2019-01-14 NOTE — Progress Notes (Signed)
*  PRELIMINARY RESULTS* Echocardiogram 2D Echocardiogram has been performed.  Gary Ho 01/14/2019, 11:34 AM

## 2019-01-21 ENCOUNTER — Other Ambulatory Visit: Payer: Self-pay | Admitting: Family Medicine

## 2019-01-28 ENCOUNTER — Other Ambulatory Visit: Payer: Self-pay | Admitting: Family Medicine

## 2019-02-14 ENCOUNTER — Other Ambulatory Visit: Payer: Self-pay | Admitting: Family Medicine

## 2019-03-04 ENCOUNTER — Ambulatory Visit: Payer: 59 | Admitting: Urology

## 2020-02-12 IMAGING — CT CT HEAD W/O CM
3 series · 16 of 47 positions shown, 19 images · non-contrast
Comparison: July 06, 2014

CLINICAL DATA: Dizziness with nausea and vomiting.

EXAM:
CT HEAD WITHOUT CONTRAST
TECHNIQUE: Contiguous axial images were obtained from the base of the skull
through the vertex without intravenous contrast.

[Series 2: head wo · axial · 0.43mm/px · z∈[+1415,+1550]mm · 10 of 33 slices shown, 13 images]
[im 3/33  brain]
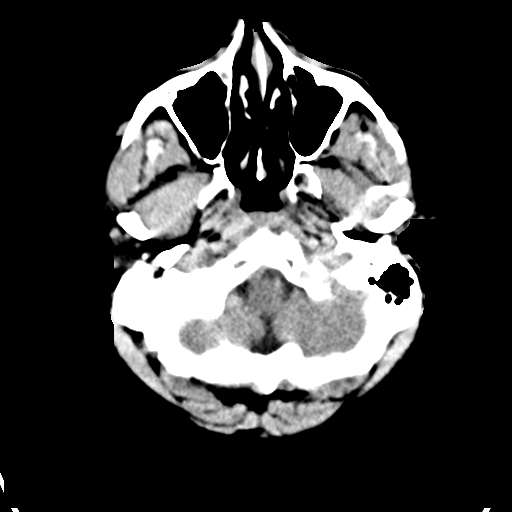
[im 3/33  bone]
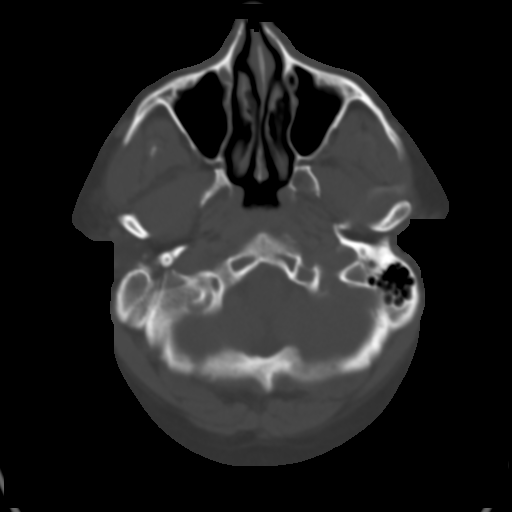
[im 6/33  brain]
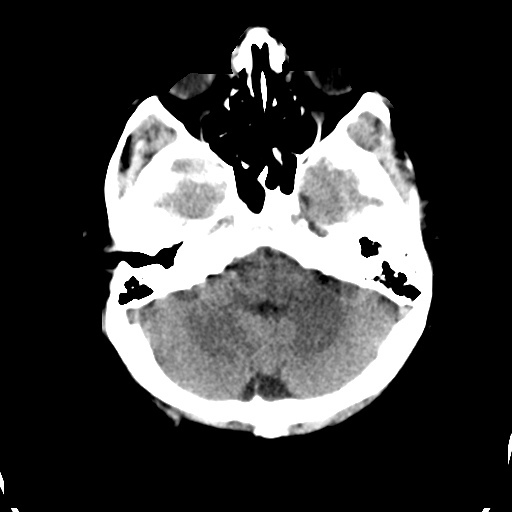
[im 9/33  brain]
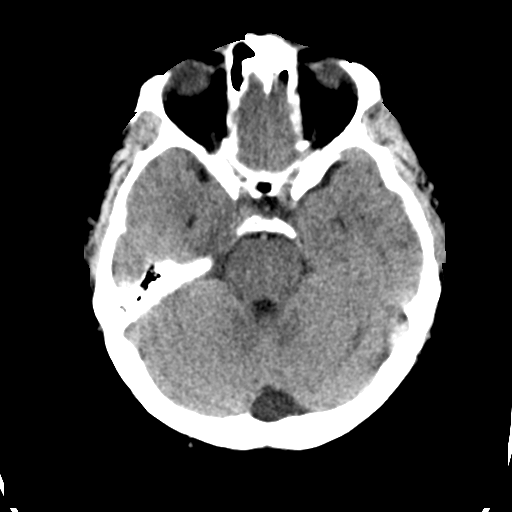
[im 12/33  brain]
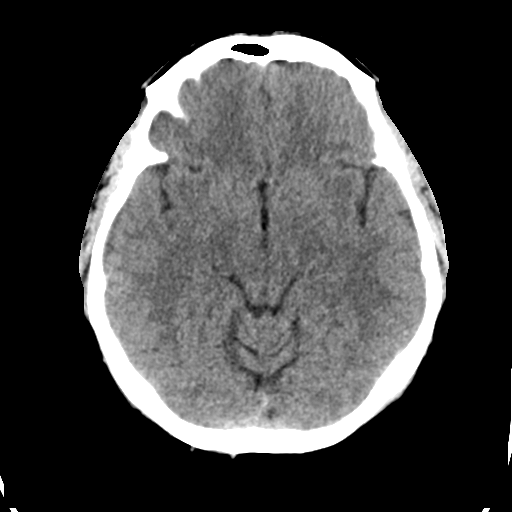
[im 15/33  brain]
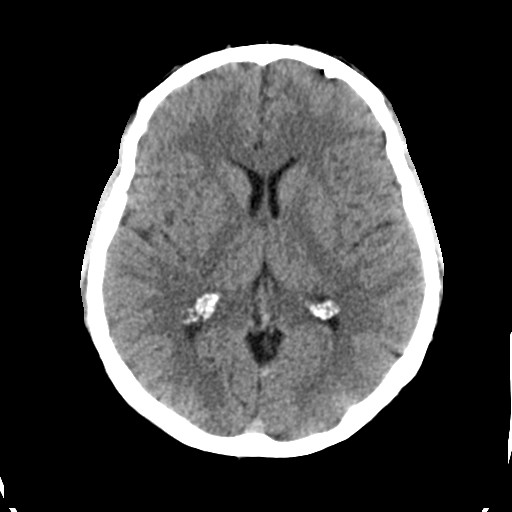
[im 15/33  bone]
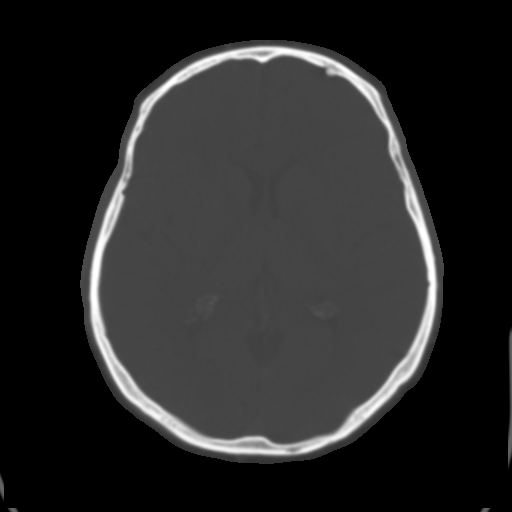
[im 18/33  brain]
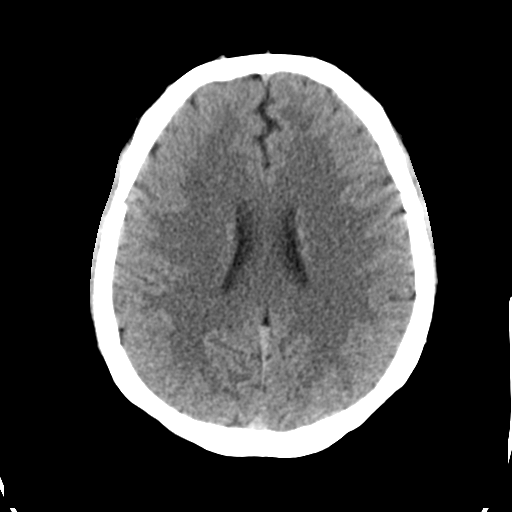
[im 21/33  brain]
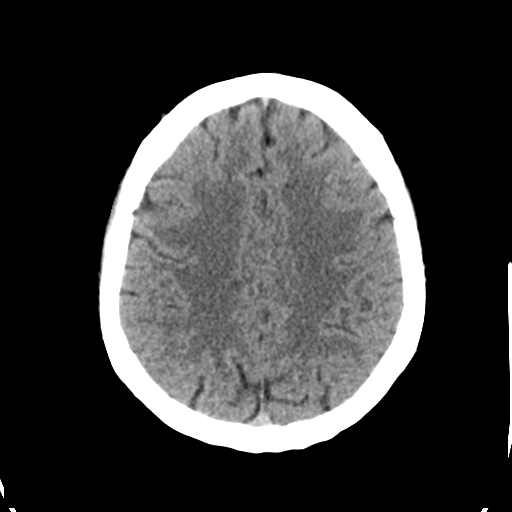
[im 25/33  brain]
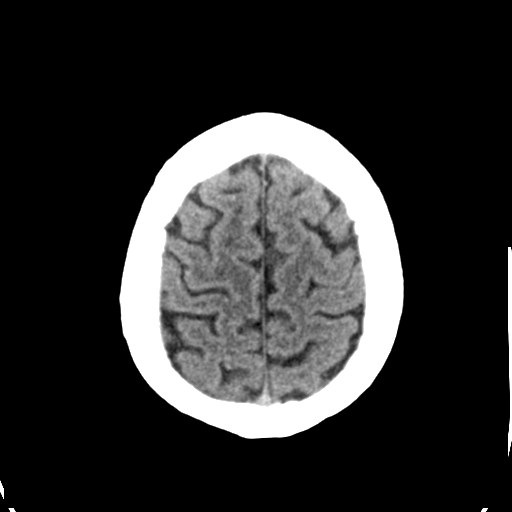
[im 27/33  brain]
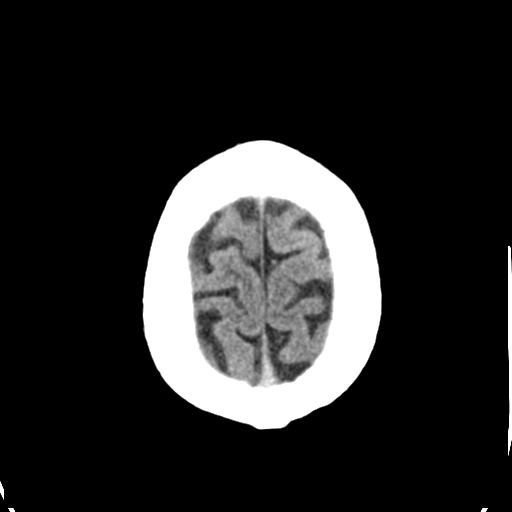
[im 27/33  bone]
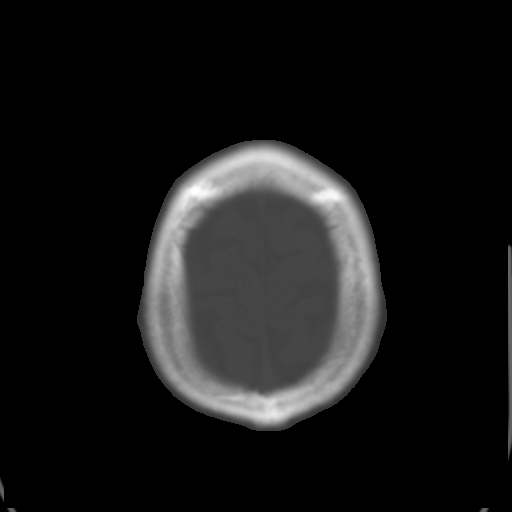
[im 30/33  brain]
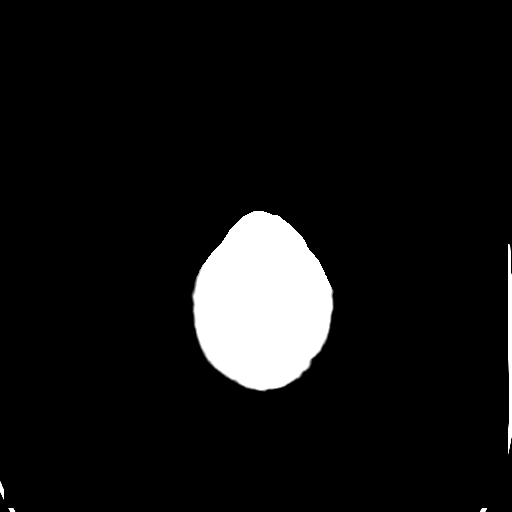

[Series 4: coronal soft tissue · coronal · 0.32mm/px · 3 of 70 slices shown]
[im 24/70  brain]
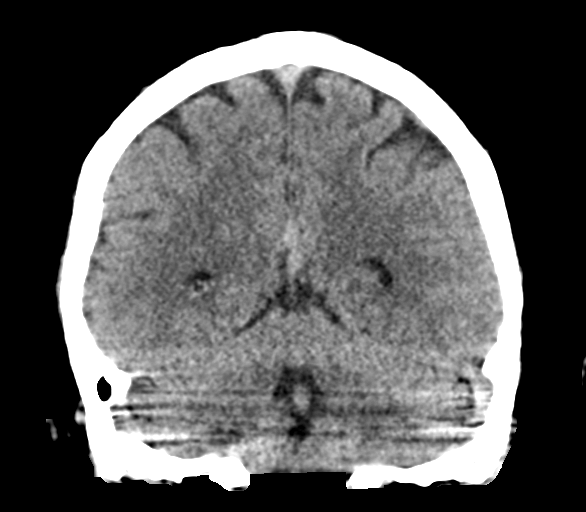
[im 31/70  brain]
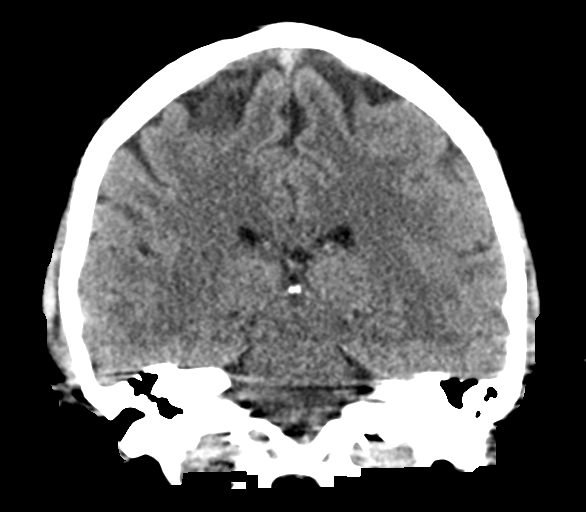
[im 39/70  brain]
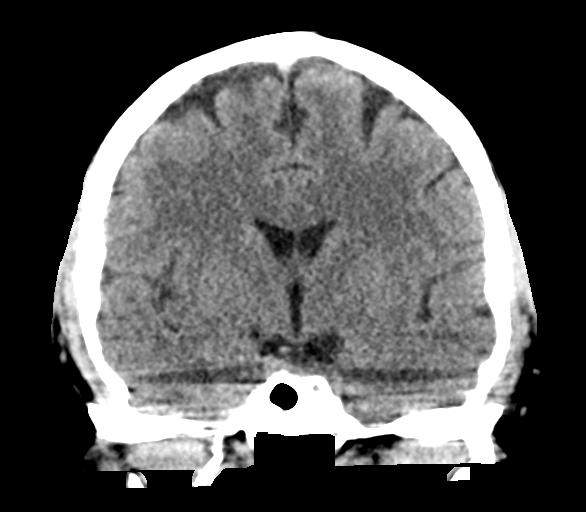

[Series 5: sagittal soft tissue · sagittal · 0.32mm/px · 3 of 62 slices shown]
[im 21/62  brain]
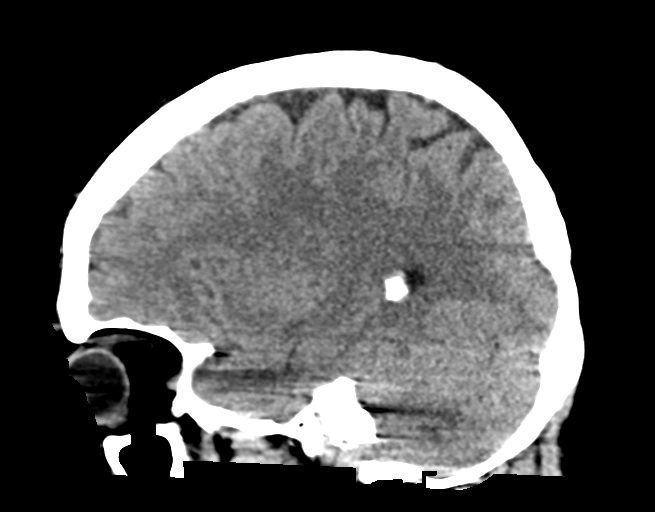
[im 31/62  brain]
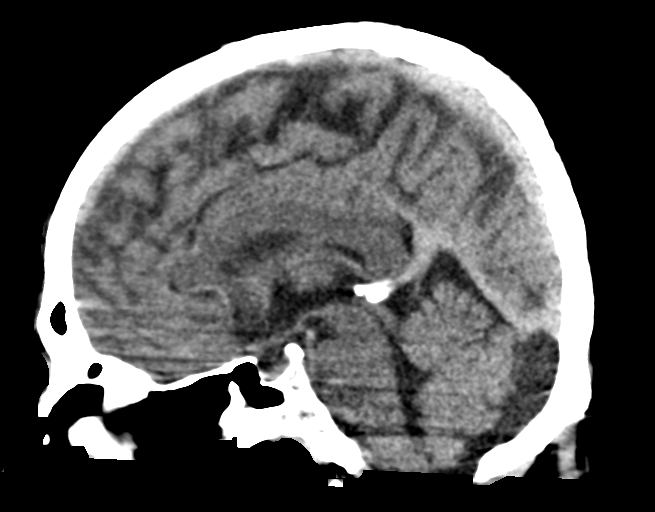
[im 41/62  brain]
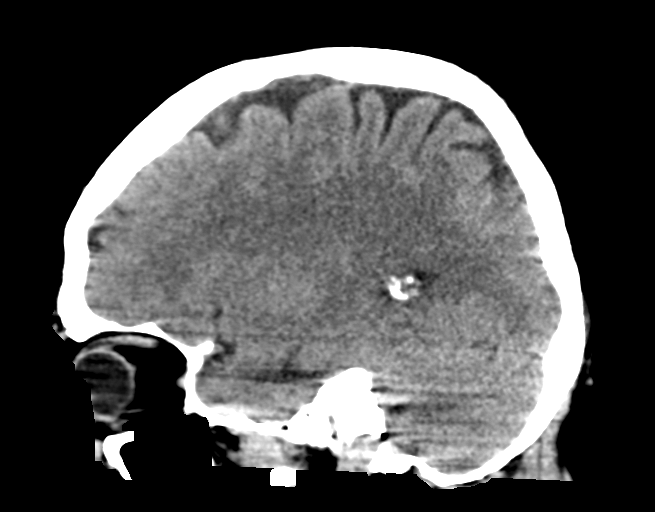

[16 of 47 positions shown; findings below may reference images not displayed]

FINDINGS: Brain: No evidence of acute infarction, hemorrhage, hydrocephalus,
extra-axial collection or mass lesion/mass effect.

Vascular: No hyperdense vessel or unexpected calcification.

Skull: Normal. Negative for fracture or focal lesion.

Sinuses/Orbits: No acute finding.

Other: None.
IMPRESSION: No cause for the patient's symptoms identified.

## 2021-04-24 ENCOUNTER — Other Ambulatory Visit: Payer: Self-pay

## 2021-06-14 ENCOUNTER — Encounter: Payer: Self-pay | Admitting: Urology

## 2021-06-14 ENCOUNTER — Other Ambulatory Visit: Payer: Self-pay

## 2021-06-14 ENCOUNTER — Ambulatory Visit (INDEPENDENT_AMBULATORY_CARE_PROVIDER_SITE_OTHER): Payer: 59 | Admitting: Urology

## 2021-06-14 VITALS — BP 112/70 | HR 59

## 2021-06-14 DIAGNOSIS — R3912 Poor urinary stream: Secondary | ICD-10-CM | POA: Diagnosis not present

## 2021-06-14 DIAGNOSIS — N401 Enlarged prostate with lower urinary tract symptoms: Secondary | ICD-10-CM | POA: Diagnosis not present

## 2021-06-14 DIAGNOSIS — N138 Other obstructive and reflux uropathy: Secondary | ICD-10-CM

## 2021-06-14 DIAGNOSIS — R972 Elevated prostate specific antigen [PSA]: Secondary | ICD-10-CM

## 2021-06-14 LAB — URINALYSIS, ROUTINE W REFLEX MICROSCOPIC
Bilirubin, UA: NEGATIVE
Ketones, UA: NEGATIVE
Leukocytes,UA: NEGATIVE
Nitrite, UA: NEGATIVE
Specific Gravity, UA: 1.025 (ref 1.005–1.030)
Urobilinogen, Ur: 0.2 mg/dL (ref 0.2–1.0)
pH, UA: 6.5 (ref 5.0–7.5)

## 2021-06-14 LAB — MICROSCOPIC EXAMINATION
Bacteria, UA: NONE SEEN
Epithelial Cells (non renal): NONE SEEN /hpf (ref 0–10)
Renal Epithel, UA: NONE SEEN /hpf
WBC, UA: NONE SEEN /hpf (ref 0–5)

## 2021-06-14 MED ORDER — TAMSULOSIN HCL 0.4 MG PO CAPS: 0.4000 mg | ORAL_CAPSULE | Freq: Every day | ORAL | 11 refills | Status: DC

## 2021-06-14 NOTE — Progress Notes (Signed)
post void residual=108 

## 2021-06-14 NOTE — Progress Notes (Signed)
06/14/2021 9:10 AM   Christia Reading Sofie Rower [birth year] [id number]  Referring provider: Asencion Noble, MD [address] [place],  [place] [address]  Elevated PSA   HPI: Mr Gary Ho is a 56yo here for evaluation of elevated PSA. PSA was 5.5 11/2020. No family hx of prostate cancer. IPSS 12 QOL 4 on no BPH therapy. He has a weak stream. Nocturia 1x. No dysuria or hematuria. No hx of prostate infection or UTI. PVR 108cc   PMH: Past Medical History:  Diagnosis Date   Diabetes mellitus without complication ([place])    Hypertension    Sleep apnea     Surgical History: No past surgical history on file.  Home Medications:  Allergies as of 06/14/2021       Reactions   Poison Sumac Extract Rash        Medication List        Accurate as of June 14, 2021  9:10 AM. If you have any questions, ask your nurse or doctor.          STOP taking these medications    blood glucose meter kit and supplies Kit Stopped by: Nicolette Bang, MD   doxazosin 4 MG tablet Commonly known as: Cardura Stopped by: Nicolette Bang, MD   LORazepam 0.5 MG tablet Commonly known as: Ativan Stopped by: Nicolette Bang, MD   metFORMIN 500 MG 24 hr tablet Commonly known as: Glucophage XR Stopped by: Nicolette Bang, MD   ondansetron 8 MG tablet Commonly known as: Zofran Stopped by: Nicolette Bang, MD   valsartan 160 MG tablet Commonly known as: Diovan Stopped by: Nicolette Bang, MD   varenicline 0.5 MG X 11 & 1 MG X 42 tablet Commonly known as: Chantix Starting Month Pak Stopped by: Nicolette Bang, MD       TAKE these medications    amLODipine 10 MG tablet Commonly known as: NORVASC Take 1 tablet by mouth once daily for blood pressure   atorvastatin 40 MG tablet Commonly known as: LIPITOR Take 1 tablet (40 mg total) by mouth daily.   losartan 100 MG tablet Commonly known as: COZAAR Take 100 mg by mouth daily.   pioglitazone 30 MG tablet Commonly known as:  ACTOS Take 30 mg by mouth daily.   triamterene-hydrochlorothiazide 37.5-25 MG tablet Commonly known as: MAXZIDE-25 TAKE 1 TABLET BY MOUTH ONCE DAILY EVERY MORNING        Allergies:  Allergies  Allergen Reactions   Poison Sumac Extract Rash    Family History: No family history on file.  Social History:  reports that he has been smoking cigarettes. He has a 20.00 pack-year smoking history. He has never used smokeless tobacco. He reports that he does not drink alcohol and does not use drugs.  ROS: All other review of systems were reviewed and are negative except what is noted above in HPI  Physical Exam: BP 112/70    Pulse (!) 59   Constitutional:  Alert and oriented, No acute distress. HEENT: [place] AT, moist mucus membranes.  Trachea midline, no masses. Cardiovascular: No clubbing, cyanosis, or edema. Respiratory: Normal respiratory effort, no increased work of breathing. GI: Abdomen is soft, nontender, nondistended, no abdominal masses GU: No CVA tenderness. Circumcised phallus. No masses/lesions on penis, testis, scrotum. Prostate 60g smooth no nodules no induration.  Lymph: No cervical or inguinal lymphadenopathy. Skin: No rashes, bruises or suspicious lesions. Neurologic: Grossly intact, no focal deficits, moving all 4 extremities. Psychiatric: Normal mood and affect.  Laboratory Data: Lab Results  Component Value Date   WBC 11.1 (H) 01/31/2018   HGB 14.9 01/31/2018   HCT 45.6 01/31/2018   MCV 88.0 01/31/2018   PLT 280 01/31/2018    Lab Results  Component Value Date   CREATININE 1.39 (H) 01/31/2018    No results found for: PSA  No results found for: TESTOSTERONE  Lab Results  Component Value Date   HGBA1C 7.1 (H) 01/08/2018    Urinalysis    Component Value Date/Time   COLORURINE YELLOW 01/31/2018 1730   APPEARANCEUR CLEAR 01/31/2018 1730   APPEARANCEUR Clear 06/19/2017 1640   LABSPEC 1.013 01/31/2018 1730   PHURINE 7.0 01/31/2018 1730   GLUCOSEU  >=500 (A) 01/31/2018 1730   HGBUR SMALL (A) 01/31/2018 1730   BILIRUBINUR NEGATIVE 01/31/2018 1730   BILIRUBINUR Negative 06/19/2017 1640   KETONESUR NEGATIVE 01/31/2018 1730   PROTEINUR 100 (A) 01/31/2018 1730   NITRITE NEGATIVE 01/31/2018 1730   LEUKOCYTESUR NEGATIVE 01/31/2018 1730   LEUKOCYTESUR Negative 06/19/2017 1640    Lab Results  Component Value Date   LABMICR See below: 06/19/2017   WBCUA None seen 06/19/2017   RBCUA 3-10 (A) 06/19/2017   LABEPIT None seen 06/19/2017   MUCUS Present 06/19/2017   BACTERIA NONE SEEN 01/31/2018    Pertinent Imaging:  No results found for this or any previous visit.  No results found for this or any previous visit.  No results found for this or any previous visit.  No results found for this or any previous visit.  No results found for this or any previous visit.  No results found for this or any previous visit.  No results found for this or any previous visit.  No results found for this or any previous visit.   Assessment & Plan:    1. Elevated PSA -PSA, if it remains elevated er will proceed with prostate biopsy - Urinalysis, Routine w reflex microscopic - BLADDER SCAN AMB NON-IMAGING  2. Benign prostatic hyperplasia with urinary obstruction, Weak stream -flomax 0.8m daily   No follow-ups on file.  PNicolette Bang MD  [facility] [place]

## 2021-06-14 NOTE — Patient Instructions (Signed)

## 2021-06-15 LAB — PSA, TOTAL AND FREE
PSA, Free Pct: 19.1 %
PSA, Free: 1.03 ng/mL
Prostate Specific Ag, Serum: 5.4 ng/mL — ABNORMAL HIGH (ref 0.0–4.0)

## 2021-07-02 ENCOUNTER — Telehealth: Payer: Self-pay

## 2021-07-02 DIAGNOSIS — R972 Elevated prostate specific antigen [PSA]: Secondary | ICD-10-CM

## 2021-07-02 NOTE — Telephone Encounter (Signed)
-----   Message from Noralyn Pick sent at 07/02/2021  9:49 AM EST -----  ----- Message ----- From: Cleon Gustin, MD Sent: 06/25/2021  11:41 AM EST To: Clearence Cheek, CMA  PSA remains elevated. The patient needs to be scheduled for a prostate biopsy ----- Message ----- From: Clearence Cheek, CMA Sent: 06/17/2021  10:41 AM EST To: Cleon Gustin, MD  Please review

## 2021-07-02 NOTE — Telephone Encounter (Signed)
Patient was called- per wife note home, called cell- no answer.

## 2021-07-03 MED ORDER — LEVOFLOXACIN 750 MG PO TABS: 750.0000 mg | ORAL_TABLET | Freq: Once | ORAL | 0 refills | Status: DC

## 2021-07-03 NOTE — Telephone Encounter (Signed)
I spoke with patient- agreed upon biopsy date of March 17th at [facility]. ? ?Instructions reviewed and mailed to patient as well.  ?

## 2021-07-04 NOTE — Addendum Note (Signed)
Addended by: Dorisann Frames on: 07/04/2021 04:35 PM ? ? Modules accepted: Orders ? ?

## 2021-07-04 NOTE — Telephone Encounter (Signed)
Patient called office and wishes to have PSA rechecked in 6 months before moving forward with prostate biopsy. ? ?MD aware.  ?

## 2021-07-19 ENCOUNTER — Other Ambulatory Visit: Payer: 59 | Admitting: Urology

## 2021-07-19 ENCOUNTER — Ambulatory Visit (HOSPITAL_COMMUNITY): Payer: 59

## 2021-07-26 ENCOUNTER — Ambulatory Visit: Payer: 59 | Admitting: Urology

## 2021-12-06 ENCOUNTER — Other Ambulatory Visit: Payer: 59

## 2021-12-09 ENCOUNTER — Other Ambulatory Visit: Payer: 59

## 2021-12-13 ENCOUNTER — Other Ambulatory Visit: Payer: 59

## 2021-12-13 ENCOUNTER — Ambulatory Visit: Payer: 59 | Admitting: Urology

## 2021-12-16 ENCOUNTER — Ambulatory Visit: Payer: 59 | Admitting: Urology

## 2021-12-20 ENCOUNTER — Other Ambulatory Visit: Payer: 59

## 2021-12-20 DIAGNOSIS — R972 Elevated prostate specific antigen [PSA]: Secondary | ICD-10-CM

## 2021-12-21 LAB — PSA, TOTAL AND FREE
PSA, Free Pct: 14.8 %
PSA, Free: 0.92 ng/mL
Prostate Specific Ag, Serum: 6.2 ng/mL — ABNORMAL HIGH (ref 0.0–4.0)

## 2021-12-27 ENCOUNTER — Ambulatory Visit: Payer: 59 | Admitting: Urology

## 2021-12-27 VITALS — BP 133/80 | HR 67

## 2021-12-27 DIAGNOSIS — N138 Other obstructive and reflux uropathy: Secondary | ICD-10-CM

## 2021-12-27 DIAGNOSIS — N401 Enlarged prostate with lower urinary tract symptoms: Secondary | ICD-10-CM

## 2021-12-27 DIAGNOSIS — R972 Elevated prostate specific antigen [PSA]: Secondary | ICD-10-CM | POA: Diagnosis not present

## 2021-12-27 DIAGNOSIS — R3912 Poor urinary stream: Secondary | ICD-10-CM

## 2021-12-27 LAB — URINALYSIS, ROUTINE W REFLEX MICROSCOPIC
Bilirubin, UA: NEGATIVE
Ketones, UA: NEGATIVE
Leukocytes,UA: NEGATIVE
Nitrite, UA: NEGATIVE
Specific Gravity, UA: 1.02 (ref 1.005–1.030)
Urobilinogen, Ur: 0.2 mg/dL (ref 0.2–1.0)
pH, UA: 6 (ref 5.0–7.5)

## 2021-12-27 LAB — MICROSCOPIC EXAMINATION
Epithelial Cells (non renal): NONE SEEN /hpf (ref 0–10)
Renal Epithel, UA: NONE SEEN /hpf
WBC, UA: NONE SEEN /hpf (ref 0–5)

## 2021-12-27 MED ORDER — LEVOFLOXACIN 750 MG PO TABS: 750.0000 mg | ORAL_TABLET | Freq: Once | ORAL | 0 refills | Status: AC

## 2021-12-27 MED ORDER — TAMSULOSIN HCL 0.4 MG PO CAPS: 0.4000 mg | ORAL_CAPSULE | Freq: Every day | ORAL | 11 refills | Status: DC

## 2021-12-27 NOTE — Patient Instructions (Signed)
   Appointment Time:12:15 Appointment Date:01/13/22  Location: [name] Department   Prostate Biopsy Instructions  Stop all aspirin or blood thinners (aspirin, plavix, coumadin, warfarin, motrin, ibuprofen, advil, aleve, naproxen, naprosyn) for 7 days prior to the procedure.  If you have any questions about stopping these medications, please contact your primary care physician or cardiologist.  Having a light meal prior to the procedure is recommended.  If you are diabetic or have low blood sugar please bring a small snack or glucose tablet.  A Fleets enema is needed to be purchased over the counter at a local pharmacy and used 2 hours before you scheduled appointment.  This can be purchased over the counter at any pharmacy.  Antibiotics will be administered in the clinic at the time of the procedure and 1 tablet has been sent to your pharmacy. Please take the antibiotic as prescribed.    Please bring someone with you to the procedure to drive you home if you are given a valium to take prior to your procedure.   If you have any questions or concerns, please feel free to call the office at [phone] or send a Mychart message.    Thank you, [facility]

## 2021-12-27 NOTE — Progress Notes (Signed)
12/27/2021 11:12 AM   Gary Ho [birth year] [id number]  Referring provider: Asencion Noble, MD [address] [place],  [place] [address]  Followup elevated PSA  HPI: Gary Ho is a 56yo here for followup for elevated PSa and BPH. IPSS 11 QOL 3 on flomax 0.'4mg'$ . Urine strema is intermittently weak. Nocturia 1-2x. PSA increased to 6.2 from 5.4   PMH: Past Medical History:  Diagnosis Date   Diabetes mellitus without complication ([place])    Hypertension    Sleep apnea     Surgical History: No past surgical history on file.  Home Medications:  Allergies as of 12/27/2021       Reactions   Poison Sumac Extract Rash        Medication List        Accurate as of December 27, 2021 11:12 AM. If you have any questions, ask your nurse or doctor.          amLODipine 10 MG tablet Commonly known as: NORVASC Take 1 tablet by mouth once daily for blood pressure   atorvastatin 40 MG tablet Commonly known as: LIPITOR Take 1 tablet (40 mg total) by mouth daily.   losartan 100 MG tablet Commonly known as: COZAAR Take 100 mg by mouth daily.   pioglitazone 30 MG tablet Commonly known as: ACTOS Take 30 mg by mouth daily.   tamsulosin 0.4 MG Caps capsule Commonly known as: FLOMAX Take 1 capsule (0.4 mg total) by mouth daily after supper.   triamterene-hydrochlorothiazide 37.5-25 MG tablet Commonly known as: MAXZIDE-25 TAKE 1 TABLET BY MOUTH ONCE DAILY EVERY MORNING        Allergies:  Allergies  Allergen Reactions   Poison Sumac Extract Rash    Family History: No family history on file.  Social History:  reports that he has been smoking cigarettes. He has a 20.00 pack-year smoking history. He has never used smokeless tobacco. He reports that he does not drink alcohol and does not use drugs.  ROS: All other review of systems were reviewed and are negative except what is noted above in HPI  Physical Exam: BP 133/80   Pulse 67   Constitutional:  Alert  and oriented, No acute distress. HEENT:  AT, moist mucus membranes.  Trachea midline, no masses. Cardiovascular: No clubbing, cyanosis, or edema. Respiratory: Normal respiratory effort, no increased work of breathing. GI: Abdomen is soft, nontender, nondistended, no abdominal masses GU: No CVA tenderness.  Lymph: No cervical or inguinal lymphadenopathy. Skin: No rashes, bruises or suspicious lesions. Neurologic: Grossly intact, no focal deficits, moving all 4 extremities. Psychiatric: Normal mood and affect.  Laboratory Data: Lab Results  Component Value Date   WBC 11.1 (H) 01/31/2018   HGB 14.9 01/31/2018   HCT 45.6 01/31/2018   MCV 88.0 01/31/2018   PLT 280 01/31/2018    Lab Results  Component Value Date   CREATININE 1.39 (H) 01/31/2018    No results found for: "PSA"  No results found for: "TESTOSTERONE"  Lab Results  Component Value Date   HGBA1C 7.1 (H) 01/08/2018    Urinalysis    Component Value Date/Time   COLORURINE YELLOW 01/31/2018 1730   APPEARANCEUR Clear 06/14/2021 0920   LABSPEC 1.013 01/31/2018 1730   PHURINE 7.0 01/31/2018 1730   GLUCOSEU 1+ (A) 06/14/2021 0920   HGBUR SMALL (A) 01/31/2018 1730   BILIRUBINUR Negative 06/14/2021 0920   KETONESUR NEGATIVE 01/31/2018 1730   PROTEINUR 2+ (A) 06/14/2021 0920   PROTEINUR 100 (A) 01/31/2018 1730  NITRITE Negative 06/14/2021 0920   NITRITE NEGATIVE 01/31/2018 1730   LEUKOCYTESUR Negative 06/14/2021 0920    Lab Results  Component Value Date   LABMICR See below: 06/14/2021   WBCUA None seen 06/14/2021   RBCUA 3-10 (A) 06/19/2017   LABEPIT None seen 06/14/2021   MUCUS Present 06/14/2021   BACTERIA None seen 06/14/2021    Pertinent Imaging:  No results found for this or any previous visit.  No results found for this or any previous visit.  No results found for this or any previous visit.  No results found for this or any previous visit.  No results found for this or any previous  visit.  No results found for this or any previous visit.  No results found for this or any previous visit.  No results found for this or any previous visit.   Assessment & Plan:    1. Elevated PSA -The patient and I talked about etiologies of elevated PSA.  We discussed the possible relationship between elevated PSA, prostate cancer, BPH, prostatitis, and UTI.   Conservative treatment of elevated PSA with watchful waiting was discussed with the patient.  All questions were answered.        All of the risks and benefits along with alternatives to prostate biopsy were discussed with the patient.  The patient gave fully informed consent to proceed with a transrectal ultrasound guided biopsy of the prostate for the evaluation of their evated PSA.  Prostate biopsy instructions and antibiotics were given to the patient.  - Urinalysis, Routine w reflex microscopic  2. Benign prostatic hyperplasia with urinary obstruction ***  3. Weak urinary stream ***   No follow-ups on file.  Nicolette Bang, MD  [facility] [place]

## 2022-01-02 ENCOUNTER — Encounter: Payer: Self-pay | Admitting: Urology

## 2022-01-13 ENCOUNTER — Other Ambulatory Visit: Payer: Self-pay | Admitting: Urology

## 2022-01-13 ENCOUNTER — Ambulatory Visit (HOSPITAL_BASED_OUTPATIENT_CLINIC_OR_DEPARTMENT_OTHER): Payer: 59 | Admitting: Urology

## 2022-01-13 ENCOUNTER — Encounter: Payer: Self-pay | Admitting: Urology

## 2022-01-13 ENCOUNTER — Ambulatory Visit (HOSPITAL_COMMUNITY)
Admission: RE | Admit: 2022-01-13 | Discharge: 2022-01-13 | Disposition: A | Discharge status: Home / Self Care | Payer: 59 | Source: Ambulatory Visit | Attending: Urology | Admitting: Urology

## 2022-01-13 ENCOUNTER — Encounter (HOSPITAL_COMMUNITY): Payer: Self-pay

## 2022-01-13 DIAGNOSIS — R972 Elevated prostate specific antigen [PSA]: Secondary | ICD-10-CM | POA: Diagnosis present

## 2022-01-13 MED ORDER — LIDOCAINE HCL (PF) 2 % IJ SOLN: 10.0000 mL | Freq: Once | INTRAMUSCULAR | Status: AC

## 2022-01-13 MED ORDER — GENTAMICIN SULFATE 40 MG/ML IJ SOLN
INTRAMUSCULAR | Status: AC
  Administered 2022-01-13: 80 mg via INTRAMUSCULAR
  Filled 2022-01-13: qty 2

## 2022-01-13 MED ORDER — GENTAMICIN SULFATE 40 MG/ML IJ SOLN: 80.0000 mg | Freq: Once | INTRAMUSCULAR | Status: AC

## 2022-01-13 MED ORDER — LIDOCAINE HCL (PF) 2 % IJ SOLN
INTRAMUSCULAR | Status: AC
  Administered 2022-01-13: 10 mL
  Filled 2022-01-13: qty 10

## 2022-01-13 NOTE — Progress Notes (Signed)
Prostate Biopsy Procedure   Informed consent was obtained after discussing risks/benefits of the procedure.  A time out was performed to ensure correct patient identity.  Pre-Procedure: - Last PSA Level: No results found for: "PSA" - Gentamicin given prophylactically - Levaquin 500 mg administered PO -Transrectal Ultrasound performed revealing a 106.7 gm prostate -No significant hypoechoic or median lobe noted  Procedure: - Prostate block performed using 10 cc 1% lidocaine and biopsies taken from sextant areas, a total of 12 under ultrasound guidance.  Post-Procedure: - Patient tolerated the procedure well - He was counseled to seek immediate medical attention if experiences any severe pain, significant bleeding, or fevers - Return in one week to discuss biopsy results

## 2022-01-13 NOTE — Progress Notes (Signed)
PT tolerated prostate biopsy procedure and antibiotic injection well today. Labs obtained and sent for pathology. PT ambulatory at discharge with no acute distress noted and verbalized understanding of discharge instructions. PT to follow up with urologist as scheduled on 01/27/22 @ 3:50pm.

## 2022-01-13 NOTE — Patient Instructions (Signed)
Transrectal Ultrasound-Guided Prostate Biopsy, Care After What can I expect after the procedure? After the procedure, it is common to have: Pain and discomfort near your butt (rectum), especially while sitting. Pink-colored pee (urine). This is due to small amounts of blood in your pee. A burning feeling while peeing. Blood in your poop (stool). Bleeding from your butt. Blood in your semen. Follow these instructions at home: Medicines Take over-the-counter and prescription medicines only as told by your doctor. If you were given a sedative during your procedure, do not drive or use machines until your doctor says that it is safe. A sedative is a medicine that helps you relax. If you were prescribed an antibiotic medicine, take it as told by your doctor. Do not stop taking it even if you start to feel better. Activity  Return to your normal activities when your doctor says that it is safe. Ask your doctor when it is okay for you to have sex. You may have to avoid lifting. Ask your doctor how much you can safely lift. General instructions  Drink enough water to keep your pee pale yellow. Watch your pee, poop, and semen for new bleeding or bleeding that gets worse. Keep all follow-up visits. Contact a doctor if: You have any of these: Blood clots in your pee or poop. Blood in your pee more than 2 weeks after the procedure. Blood in your semen more than 2 months after the procedure. New or worse bleeding in your pee, poop, or semen. Very bad belly pain. Your pee smells bad or unusual. You have trouble peeing. Your lower belly feels firm. You have problems getting an erection. You feel like you may vomit (are nauseous), or you vomit. Get help right away if: You have a fever or chills. You have bright red pee. You have very bad pain that does not get better with medicine. You cannot pee. Summary After this procedure, it is common to have pain and discomfort near your butt,  especially while sitting. You may have blood in your pee and poop. It is common to have blood in your semen. Get help right away if you have a fever or chills. This information is not intended to replace advice given to you by your health care provider. Make sure you discuss any questions you have with your health care provider. Document Revised: 10/15/2020 Document Reviewed: 10/15/2020 Elsevier Patient Education  2023 Elsevier Inc.  

## 2022-01-15 ENCOUNTER — Telehealth: Payer: Self-pay

## 2022-01-15 NOTE — Telephone Encounter (Signed)
Patient called and made aware of negative prostate biopsy per Dr. Alyson Ingles.  Patient will f/u 6 months with PSA. Appointment made and sent via mail and my chart.

## 2022-01-27 ENCOUNTER — Ambulatory Visit: Payer: 59 | Admitting: Urology

## 2022-03-12 ENCOUNTER — Other Ambulatory Visit: Payer: Self-pay

## 2022-03-12 ENCOUNTER — Emergency Department (HOSPITAL_COMMUNITY)
Admission: EM | Admit: 2022-03-12 | Discharge: 2022-03-12 | Discharge status: Against Medical Advice | Payer: 59 | Attending: Emergency Medicine | Admitting: Emergency Medicine

## 2022-03-12 ENCOUNTER — Emergency Department (HOSPITAL_COMMUNITY): Payer: 59

## 2022-03-12 DIAGNOSIS — Z5321 Procedure and treatment not carried out due to patient leaving prior to being seen by health care provider: Secondary | ICD-10-CM | POA: Insufficient documentation

## 2022-03-12 DIAGNOSIS — I10 Essential (primary) hypertension: Secondary | ICD-10-CM | POA: Insufficient documentation

## 2022-03-12 DIAGNOSIS — E119 Type 2 diabetes mellitus without complications: Secondary | ICD-10-CM | POA: Diagnosis not present

## 2022-03-12 DIAGNOSIS — R062 Wheezing: Secondary | ICD-10-CM | POA: Insufficient documentation

## 2022-03-12 DIAGNOSIS — R112 Nausea with vomiting, unspecified: Secondary | ICD-10-CM | POA: Diagnosis not present

## 2022-03-12 DIAGNOSIS — R0602 Shortness of breath: Secondary | ICD-10-CM | POA: Diagnosis not present

## 2022-03-12 DIAGNOSIS — R0789 Other chest pain: Secondary | ICD-10-CM | POA: Insufficient documentation

## 2022-03-12 LAB — BASIC METABOLIC PANEL
Anion gap: 9 (ref 5–15)
BUN: 18 mg/dL (ref 6–20)
CO2: 24 mmol/L (ref 22–32)
Calcium: 8.9 mg/dL (ref 8.9–10.3)
Chloride: 105 mmol/L (ref 98–111)
Creatinine, Ser: 1.28 mg/dL — ABNORMAL HIGH (ref 0.61–1.24)
GFR, Estimated: >60 mL/min (ref >60)
Glucose, Bld: 176 mg/dL — ABNORMAL HIGH (ref 70–99)
Potassium: 3.5 mmol/L (ref 3.5–5.1)
Sodium: 138 mmol/L (ref 135–145)

## 2022-03-12 LAB — CBC
HCT: 47.6 % (ref 39.0–52.0)
Hemoglobin: 15.2 g/dL (ref 13.0–17.0)
MCH: 28.8 pg (ref 26.0–34.0)
MCHC: 31.9 g/dL (ref 30.0–36.0)
MCV: 90.3 fL (ref 80.0–100.0)
Platelets: 241 10*3/uL (ref 150–400)
RBC: 5.27 MIL/uL (ref 4.22–5.81)
RDW: 13.9 % (ref 11.5–15.5)
WBC: 10.1 10*3/uL (ref 4.0–10.5)
nRBC: 0 % (ref 0.0–0.2)

## 2022-03-12 LAB — TROPONIN I (HIGH SENSITIVITY)
Troponin I (High Sensitivity): 9 ng/L (ref <18)
Troponin I (High Sensitivity): 8 ng/L (ref <18)

## 2022-03-12 NOTE — ED Triage Notes (Signed)
EMS stated, he woke up with chest pain, SOB with N/V .Had some wheezing given a neb.breathing better but in pain.  Given Nitro .4 ASA 324 20g in left hand.

## 2022-03-12 NOTE — ED Notes (Signed)
No response when being called and pt cannot be found

## 2022-03-12 NOTE — ED Provider Triage Note (Signed)
Emergency Medicine Provider Triage Evaluation Note  Gary Ho , a 56 y.o. male  was evaluated in triage.  Hypertension, diabetes, sleep apnea, smoker  Central chest pain onset around 3 AM this morning, EMS was called patient received aspirin and nitro.  Pain resolved at this time.  Review of Systems  Positive: Chest pain, nausea Negative: Vomiting, diarrhea, abdominal pain, fever, chills, cough/hemoptysis or any additional concerns  Physical Exam  BP 111/63 (BP Location: Left Arm)   Pulse (!) 59   Temp (!) 97.5 F (36.4 C)   Resp 16   Ht '5\' 11"'$  (1.803 m)   Wt 104.3 kg   SpO2 98%   BMI 32.08 kg/m  Gen:   Awake, no distress   Resp:  Normal effort, coarse lung sounds bilaterally MSK:   Moves extremities without difficulty  Other:  Heart regular rate and rhythm.  No lower extremity edema.  Medical Decision Making  Medically screening exam initiated at 11:34 AM.  Appropriate orders placed.  Calla Kicks was informed that the remainder of the evaluation will be completed by another provider, this initial triage assessment does not replace that evaluation, and the importance of remaining in the ED until their evaluation is complete.   Note: Portions of this report may have been transcribed using voice recognition software. Every effort was made to ensure accuracy; however, inadvertent computerized transcription errors may still be present.    Deliah Boston, PA-C 03/12/22 1135

## 2022-03-18 ENCOUNTER — Encounter (HOSPITAL_COMMUNITY): Payer: Self-pay | Admitting: Internal Medicine

## 2022-03-18 ENCOUNTER — Other Ambulatory Visit (HOSPITAL_COMMUNITY): Payer: Self-pay | Admitting: Internal Medicine

## 2022-03-18 DIAGNOSIS — R079 Chest pain, unspecified: Secondary | ICD-10-CM

## 2022-03-28 ENCOUNTER — Ambulatory Visit (HOSPITAL_COMMUNITY)
Admission: RE | Admit: 2022-03-28 | Discharge: 2022-03-28 | Disposition: A | Discharge status: Home / Self Care | Payer: 59 | Source: Ambulatory Visit | Attending: Internal Medicine | Admitting: Internal Medicine

## 2022-03-28 DIAGNOSIS — R079 Chest pain, unspecified: Secondary | ICD-10-CM | POA: Insufficient documentation

## 2022-03-31 ENCOUNTER — Encounter: Payer: Self-pay | Admitting: Internal Medicine

## 2022-05-07 ENCOUNTER — Other Ambulatory Visit (HOSPITAL_COMMUNITY): Payer: Self-pay | Admitting: Internal Medicine

## 2022-05-07 DIAGNOSIS — R079 Chest pain, unspecified: Secondary | ICD-10-CM

## 2022-05-15 ENCOUNTER — Telehealth (HOSPITAL_COMMUNITY): Payer: Self-pay | Admitting: *Deleted

## 2022-05-15 NOTE — Telephone Encounter (Signed)
Patient returning call regarding his upcoming cardiac imaging study; pt verbalizes understanding of appt date/time, parking situation and where to check in, pre-test NPO status and verified current allergies; name and call back number provided for further questions should they arise  Gordy Clement RN Navigator Cardiac Imaging [facility] [phone] office [phone] cell  Patient to take daily medications two hours prior to his cardiac CT scan. He is aware to arrive at 9:30am.

## 2022-05-15 NOTE — Telephone Encounter (Signed)
Attempted to call patient regarding upcoming cardiac CT appointment. Voicemail full and unable to leave a message.  Gordy Clement RN Navigator Cardiac Imaging [facility] : [facility] [phone] Office [phone] Cell

## 2022-05-16 ENCOUNTER — Ambulatory Visit (HOSPITAL_COMMUNITY)
Admission: RE | Admit: 2022-05-16 | Discharge: 2022-05-16 | Disposition: A | Discharge status: Home / Self Care | Payer: 59 | Source: Ambulatory Visit | Attending: Internal Medicine | Admitting: Internal Medicine

## 2022-05-16 DIAGNOSIS — R079 Chest pain, unspecified: Secondary | ICD-10-CM | POA: Insufficient documentation

## 2022-05-16 LAB — POCT I-STAT CREATININE: Creatinine, Ser: 1.5 mg/dL — ABNORMAL HIGH (ref 0.61–1.24)

## 2022-05-16 MED ORDER — IOHEXOL 350 MG/ML SOLN
100.0000 mL | Freq: Once | INTRAVENOUS | Status: AC | PRN
  Administered 2022-05-16: 100 mL via INTRAVENOUS

## 2022-05-16 MED ORDER — NITROGLYCERIN 0.4 MG SL SUBL
0.8000 mg | SUBLINGUAL_TABLET | Freq: Once | SUBLINGUAL | Status: AC
  Administered 2022-05-16: 0.8 mg via SUBLINGUAL

## 2022-05-16 MED ORDER — NITROGLYCERIN 0.4 MG SL SUBL
SUBLINGUAL_TABLET | SUBLINGUAL | Status: AC
  Filled 2022-05-16: qty 2

## 2022-06-06 ENCOUNTER — Other Ambulatory Visit: Payer: 59

## 2022-06-13 ENCOUNTER — Ambulatory Visit: Payer: 59 | Admitting: Urology

## 2022-06-16 ENCOUNTER — Ambulatory Visit: Payer: 59 | Admitting: Urology

## 2022-07-20 ENCOUNTER — Emergency Department (HOSPITAL_COMMUNITY): Payer: 59

## 2022-07-20 ENCOUNTER — Emergency Department (HOSPITAL_COMMUNITY)
Admission: EM | Admit: 2022-07-20 | Discharge: 2022-07-21 | Disposition: A | Discharge status: Home / Self Care | Payer: 59 | Attending: Emergency Medicine | Admitting: Emergency Medicine

## 2022-07-20 ENCOUNTER — Encounter (HOSPITAL_COMMUNITY): Payer: Self-pay | Admitting: Emergency Medicine

## 2022-07-20 ENCOUNTER — Other Ambulatory Visit: Payer: Self-pay

## 2022-07-20 DIAGNOSIS — I10 Essential (primary) hypertension: Secondary | ICD-10-CM | POA: Diagnosis not present

## 2022-07-20 DIAGNOSIS — Z79899 Other long term (current) drug therapy: Secondary | ICD-10-CM | POA: Insufficient documentation

## 2022-07-20 DIAGNOSIS — K8020 Calculus of gallbladder without cholecystitis without obstruction: Secondary | ICD-10-CM | POA: Insufficient documentation

## 2022-07-20 DIAGNOSIS — E119 Type 2 diabetes mellitus without complications: Secondary | ICD-10-CM | POA: Insufficient documentation

## 2022-07-20 DIAGNOSIS — R1013 Epigastric pain: Secondary | ICD-10-CM | POA: Diagnosis present

## 2022-07-20 MED ORDER — SODIUM CHLORIDE 0.9 % IV BOLUS
1000.0000 mL | Freq: Once | INTRAVENOUS | Status: AC
  Administered 2022-07-21: 1000 mL via INTRAVENOUS

## 2022-07-20 MED ORDER — HYDROMORPHONE HCL 1 MG/ML IJ SOLN
1.0000 mg | Freq: Once | INTRAMUSCULAR | Status: AC
  Administered 2022-07-21: 1 mg via INTRAVENOUS
  Filled 2022-07-20: qty 1

## 2022-07-20 MED ORDER — ONDANSETRON HCL 4 MG/2ML IJ SOLN
4.0000 mg | Freq: Once | INTRAMUSCULAR | Status: AC
  Administered 2022-07-21: 4 mg via INTRAVENOUS
  Filled 2022-07-20: qty 2

## 2022-07-20 NOTE — ED Triage Notes (Signed)
Pt with c/o epigastric pain and vomiting after eating this evening. Also c/o SOB.

## 2022-07-21 ENCOUNTER — Emergency Department (HOSPITAL_COMMUNITY): Payer: 59

## 2022-07-21 LAB — CBC
HCT: 47.8 % (ref 39.0–52.0)
Hemoglobin: 15.7 g/dL (ref 13.0–17.0)
MCH: 28.3 pg (ref 26.0–34.0)
MCHC: 32.8 g/dL (ref 30.0–36.0)
MCV: 86.3 fL (ref 80.0–100.0)
Platelets: 236 10*3/uL (ref 150–400)
RBC: 5.54 MIL/uL (ref 4.22–5.81)
RDW: 15.1 % (ref 11.5–15.5)
WBC: 12.9 10*3/uL — ABNORMAL HIGH (ref 4.0–10.5)
nRBC: 0 % (ref 0.0–0.2)

## 2022-07-21 LAB — COMPREHENSIVE METABOLIC PANEL
ALT: 26 U/L (ref 0–44)
AST: 28 U/L (ref 15–41)
Albumin: 3.7 g/dL (ref 3.5–5.0)
Alkaline Phosphatase: 93 U/L (ref 38–126)
Anion gap: 12 (ref 5–15)
BUN: 21 mg/dL — ABNORMAL HIGH (ref 6–20)
CO2: 22 mmol/L (ref 22–32)
Calcium: 9 mg/dL (ref 8.9–10.3)
Chloride: 101 mmol/L (ref 98–111)
Creatinine, Ser: 1.58 mg/dL — ABNORMAL HIGH (ref 0.61–1.24)
GFR, Estimated: 51 mL/min — ABNORMAL LOW (ref >60)
Glucose, Bld: 216 mg/dL — ABNORMAL HIGH (ref 70–99)
Potassium: 3.5 mmol/L (ref 3.5–5.1)
Sodium: 135 mmol/L (ref 135–145)
Total Bilirubin: 0.7 mg/dL (ref 0.3–1.2)
Total Protein: 7.4 g/dL (ref 6.5–8.1)

## 2022-07-21 LAB — TROPONIN I (HIGH SENSITIVITY)
Troponin I (High Sensitivity): 10 ng/L (ref <18)
Troponin I (High Sensitivity): 11 ng/L (ref <18)

## 2022-07-21 LAB — LIPASE, BLOOD: Lipase: 90 U/L — ABNORMAL HIGH (ref 11–51)

## 2022-07-21 MED ORDER — HYDROCODONE-ACETAMINOPHEN 5-325 MG PO TABS: 1.0000 | ORAL_TABLET | Freq: Four times a day (QID) | ORAL | 0 refills | Status: DC | PRN

## 2022-07-21 MED ORDER — IOHEXOL 300 MG/ML  SOLN
100.0000 mL | Freq: Once | INTRAMUSCULAR | Status: AC | PRN
  Administered 2022-07-21: 100 mL via INTRAVENOUS

## 2022-07-21 MED ORDER — ONDANSETRON 4 MG PO TBDP: 4.0000 mg | ORAL_TABLET | Freq: Three times a day (TID) | ORAL | 0 refills | Status: DC | PRN

## 2022-07-21 NOTE — ED Provider Notes (Signed)
[place]  Provider Note  CSN: [id number] Arrival date & time: 07/20/22 2319  History Chief Complaint  Patient presents with   Abdominal Pain    Gary Ho is a 57 y.o. male with history of HTN and DM reports onset of gradually worsening epigastric pain, radiating into his back with SOB and vomiting since eating dinner tonight. He reports he has had similar episodes off and on for 2 years without a diagnosis. He was seen in [facility] in Nov 2023 for chest pain, had a cardiac workup then but left after triage/MSE. He had a CT Coronary study done in Jan 2024 with a calcium score of 0. He has not had evaluation of his liver, gall bladder, pancreas etc that I can find in Epic.    Home Medications Prior to Admission medications   Medication Sig Start Date End Date Taking? Authorizing Provider  HYDROcodone-acetaminophen (NORCO/VICODIN) 5-325 MG tablet Take 1 tablet by mouth every 6 (six) hours as needed for severe pain. 07/21/22  Yes Truddie Hidden, MD  ondansetron (ZOFRAN-ODT) 4 MG disintegrating tablet Take 1 tablet (4 mg total) by mouth every 8 (eight) hours as needed for nausea or vomiting. 07/21/22  Yes Truddie Hidden, MD  amLODipine (NORVASC) 10 MG tablet Take 1 tablet by mouth once daily for blood pressure 01/28/19   Claretta Fraise, MD  atorvastatin (LIPITOR) 40 MG tablet Take 1 tablet (40 mg total) by mouth daily. 01/15/18   Claretta Fraise, MD  losartan (COZAAR) 100 MG tablet Take 100 mg by mouth daily. 04/27/21   [provider]  pioglitazone (ACTOS) 30 MG tablet Take 30 mg by mouth daily. 04/27/21   [provider]  tamsulosin (FLOMAX) 0.4 MG CAPS capsule Take 1 capsule (0.4 mg total) by mouth daily after supper. 12/27/21   McKenzie, Candee Furbish, MD  triamterene-hydrochlorothiazide (MAXZIDE-25) 37.5-25 MG tablet TAKE 1 TABLET BY MOUTH ONCE DAILY EVERY MORNING 01/15/18   Claretta Fraise, MD     Allergies    Poison  sumac extract   Review of Systems   Review of Systems Please see HPI for pertinent positives and negatives  Physical Exam BP 108/69   Pulse (!) 48   Temp 97.7 F (36.5 C) (Oral)   Resp 13   Ht 5\' 11"  (1.803 m)   Wt 106.6 kg   SpO2 94%   BMI 32.78 kg/m   Physical Exam Vitals and nursing note reviewed.  Constitutional:      Appearance: Normal appearance.  HENT:     Head: Normocephalic and atraumatic.     Nose: Nose normal.     Mouth/Throat:     Mouth: Mucous membranes are moist.  Eyes:     Extraocular Movements: Extraocular movements intact.     Conjunctiva/sclera: Conjunctivae normal.  Cardiovascular:     Rate and Rhythm: Normal rate.  Pulmonary:     Effort: Pulmonary effort is normal.     Breath sounds: Normal breath sounds.  Abdominal:     General: Abdomen is flat.     Palpations: Abdomen is soft.     Tenderness: There is abdominal tenderness in the right upper quadrant and epigastric area. There is guarding. Positive signs include Murphy's sign. Negative signs include McBurney's sign.  Musculoskeletal:        General: No swelling. Normal range of motion.     Cervical back: Neck supple.  Skin:    General: Skin is warm and dry.  Neurological:  General: No focal deficit present.     Mental Status: He is alert.  Psychiatric:        Mood and Affect: Mood normal.     ED Results / Procedures / Treatments   EKG EKG Interpretation  Date/Time:  Sunday July 20 2022 23:48:21 EDT Ventricular Rate:  62 PR Interval:  166 QRS Duration: 97 QT Interval:  428 QTC Calculation: 435 R Axis:   76 Text Interpretation: Interpretation limited secondary to artifact Confirmed by [place] ([id number]) on 07/20/2022 11:51:57 PM  Procedures Procedures  Medications Ordered in the ED Medications  ondansetron (ZOFRAN) injection 4 mg (4 mg Intravenous Given 07/21/22 0006)  sodium chloride 0.9 % bolus 1,000 mL (0 mLs Intravenous Stopped 07/21/22 0100)  HYDROmorphone  (DILAUDID) injection 1 mg (1 mg Intravenous Given 07/21/22 0006)  iohexol (OMNIPAQUE) 300 MG/ML solution 100 mL (100 mLs Intravenous Contrast Given 07/21/22 0056)    Initial Impression and Plan  Patient here with epigastric pain, vomiting after eating. He has apparently had this in the past worked up as chest pain but never had abdominal evaluation. Will check labs, pain/nausea medications and IVF for comfort. Plan CT abdomen for further evaluation.   ED Course   Clinical Course as of 07/21/22 0159  Mon Jul 21, 2022  0014 CBC with mild leukocytosis.  [CS]  0014 I personally viewed the images from radiology studies and agree with radiologist interpretation: CXR is clear.   [CS]  0028 Troponin is normal. 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 Lipase is mildly elevated, other LFTs are normal.  [CS]  0131 I personally viewed the images from radiology studies and agree with radiologist interpretation: CT shows gall stone, but no signs of cholecystitis, choledocholithiasis or pancreatitis.  [CS]  0155 Repeat Trop remains normal. Pain is well controlled. Plan discharge with rx for pain, nausea meds. Advised to avoid fatty, greasy foods. Referral to General Surgery for discussion of cholecystectomy. RTED for any uncontrolled pain, fever, or other concerns.  [CS]    Clinical Course User Index [CS] Reshonda Koerber B, MD     MDM Rules/Calculators/A&P Medical Decision Making Given presenting complaint, I considered that admission might be necessary. After review of results from ED lab and/or imaging studies, admission to the hospital is not indicated at this time.    Problems Addressed: Calculus of gallbladder without cholecystitis without obstruction: acute illness or injury  Amount and/or Complexity of Data Reviewed Labs: ordered. Decision-making details documented in ED Course. Radiology: ordered and independent interpretation performed. Decision-making details documented in ED  Course. ECG/medicine tests: ordered and independent interpretation performed. Decision-making details documented in ED Course.  Risk Prescription drug management. Parenteral controlled substances. Decision regarding hospitalization.     Final Clinical Impression(s) / ED Diagnoses Final diagnoses:  Calculus of gallbladder without cholecystitis without obstruction    Rx / DC Orders ED Discharge Orders          Ordered    HYDROcodone-acetaminophen (NORCO/VICODIN) 5-325 MG tablet  Every 6 hours PRN        07/21/22 0158    ondansetron (ZOFRAN-ODT) 4 MG disintegrating tablet  Every 8 hours PRN        03 /18/24 0158             Truddie Hidden, MD 07/21/22 [phone]

## 2022-07-21 NOTE — ED Provider Notes (Incomplete)
   [name]  Provider Note  CSN: [id number] Arrival date & time: 07/20/22 2319  History Chief Complaint  Patient presents with  . Abdominal Pain    Gary Ho is a 57 y.o. male    Home Medications Prior to Admission medications   Medication Sig Start Date End Date Taking? Authorizing Provider  amLODipine (NORVASC) 10 MG tablet Take 1 tablet by mouth once daily for blood pressure 01/28/19   Claretta Fraise, MD  atorvastatin (LIPITOR) 40 MG tablet Take 1 tablet (40 mg total) by mouth daily. 01/15/18   Claretta Fraise, MD  losartan (COZAAR) 100 MG tablet Take 100 mg by mouth daily. 04/27/21   [provider]  pioglitazone (ACTOS) 30 MG tablet Take 30 mg by mouth daily. 04/27/21   [provider]  tamsulosin (FLOMAX) 0.4 MG CAPS capsule Take 1 capsule (0.4 mg total) by mouth daily after supper. 12/27/21   McKenzie, Candee Furbish, MD  triamterene-hydrochlorothiazide (MAXZIDE-25) 37.5-25 MG tablet TAKE 1 TABLET BY MOUTH ONCE DAILY EVERY MORNING 01/15/18   Claretta Fraise, MD     Allergies    Poison sumac extract   Review of Systems   Review of Systems Please see HPI for pertinent positives and negatives  Physical Exam BP (!) 135/99 (BP Location: Left Arm)   Pulse 60   Temp 97.7 F (36.5 C) (Oral)   Resp (!) 22   Ht 5\' 11"  (1.803 m)   Wt 106.6 kg   SpO2 100%   BMI 32.78 kg/m   Physical Exam  ED Results / Procedures / Treatments   EKG EKG Interpretation  Date/Time:  Sunday July 20 2022 23:48:21 EDT Ventricular Rate:  62 PR Interval:  166 QRS Duration: 97 QT Interval:  428 QTC Calculation: 435 R Axis:   76 Text Interpretation: Interpretation limited secondary to artifact Confirmed by Calvert Cantor [phone]) on 07/20/2022 11:51:57 PM  Procedures Procedures  Medications Ordered in the ED Medications  ondansetron (ZOFRAN) injection 4 mg (has no administration in time range)  sodium chloride 0.9 %  bolus 1,000 mL (has no administration in time range)  HYDROmorphone (DILAUDID) injection 1 mg (has no administration in time range)    Initial Impression and Plan  ***  ED Course       MDM Rules/Calculators/A&P Medical Decision Making Amount and/or Complexity of Data Reviewed Labs: ordered. Radiology: ordered.  Risk Prescription drug management.     Final Clinical Impression(s) / ED Diagnoses Final diagnoses:  None    Rx / DC Orders ED Discharge Orders     None

## 2022-07-21 NOTE — ED Notes (Signed)
ED Provider at bedside. 

## 2022-07-21 NOTE — ED Notes (Signed)
Patient transported to CT 

## 2022-07-29 ENCOUNTER — Encounter: Payer: Self-pay | Admitting: Surgery

## 2022-07-29 ENCOUNTER — Other Ambulatory Visit: Payer: Self-pay

## 2022-07-29 ENCOUNTER — Ambulatory Visit: Payer: 59 | Admitting: Surgery

## 2022-07-29 VITALS — BP 111/72 | HR 52 | Temp 98.2°F | Resp 18 | Ht 71.0 in | Wt 245.0 lb

## 2022-07-29 DIAGNOSIS — K429 Umbilical hernia without obstruction or gangrene: Secondary | ICD-10-CM | POA: Diagnosis not present

## 2022-07-29 DIAGNOSIS — K8020 Calculus of gallbladder without cholecystitis without obstruction: Secondary | ICD-10-CM | POA: Diagnosis not present

## 2022-07-29 NOTE — Progress Notes (Incomplete)
[facility] History and Physical  Reason for Referral:*** Referring Physician: ***  Chief Complaint   New Patient (Initial Visit)     Gary Ho is a 57 y.o. male.  HPI: ***.  The *** started *** and has had a duration of ***.  It is associated with ***.  The *** is improved with ***, and is made worse with ***.    Quality*** Context***  Past Medical History:  Diagnosis Date  . Diabetes mellitus without complication (HCC)   . Hypertension   . Sleep apnea     History reviewed. No pertinent surgical history.  History reviewed. No pertinent family history.  Social History   Tobacco Use  . Smoking status: Every Day    Packs/day: 1.00    Years: 20.00    Additional pack years: 0.00    Total pack years: 20.00    Types: Cigarettes  . Smokeless tobacco: Never  Vaping Use  . Vaping Use: Never used  Substance Use Topics  . Alcohol use: No  . Drug use: No    Medications: {medication reviewed/display:3041432} Allergies as of 07/29/2022       Reactions   Poison Sumac Extract Rash        Medication List        Accurate as of July 29, 2022  3:20 PM. If you have any questions, ask your nurse or doctor.          STOP taking these medications    ondansetron 4 MG disintegrating tablet Commonly known as: ZOFRAN-ODT Stopped by: Uliana Brinker A Keaghan Staton, DO       TAKE these medications    amLODipine 10 MG tablet Commonly known as: NORVASC Take 1 tablet by mouth once daily for blood pressure   atorvastatin 20 MG tablet Commonly known as: LIPITOR Take 20 mg by mouth daily. What changed: Another medication with the same name was removed. Continue taking this medication, and follow the directions you see here. Changed by: Braidan Ricciardi A Ezra Marquess, DO   HYDROcodone-acetaminophen 5-325 MG tablet Commonly known as: NORCO/VICODIN Take 1 tablet by mouth every 6 (six) hours as needed for severe pain.   losartan 100 MG tablet Commonly known  as: COZAAR Take 100 mg by mouth daily.   metoprolol succinate 25 MG 24 hr tablet Commonly known as: TOPROL-XL Take 25 mg by mouth daily.   pioglitazone 30 MG tablet Commonly known as: ACTOS Take 30 mg by mouth daily.   tamsulosin 0.4 MG Caps capsule Commonly known as: FLOMAX Take 1 capsule (0.4 mg total) by mouth daily after supper.   triamterene-hydrochlorothiazide 37.5-25 MG tablet Commonly known as: MAXZIDE-25 TAKE 1 TABLET BY MOUTH ONCE DAILY EVERY MORNING         ROS:  {Review of Systems:30496}  Blood pressure 111/72, pulse (!) 52, temperature 98.2 F (36.8 C), temperature source Oral, resp. rate 18, height 5\' 11"  (1.803 m), weight 245 lb (111.1 kg), SpO2 93 %. Physical Exam  Results: No results found for this or any previous visit (from the past 48 hour(s)).  No results found.   Assessment & Plan:  Gary Ho is a 57 y.o. male with *** -*** -*** -Follow up ***  All questions were answered to the satisfaction of the patient and family***.  The risk and benefits of *** were discussed including but not limited to ***.  After careful consideration, Gary Ho has decided to ***.    Esmerelda Finnigan A Minka Knight 07/29/2022, 3:20 PM   Santina Evans  Jarry Manon, DO [facility] [address] [place], [place] [address] [phone] (office)

## 2022-07-30 NOTE — Addendum Note (Signed)
Addended by: Graciella Freer A on: 07/30/2022 02:34 PM   Modules accepted: Level of Service

## 2022-07-30 NOTE — H&P (Addendum)
[facility] History and Physical  Reason for Referral: Cholelithiasis Referring Physician: ER referral  Chief Complaint   New Patient (Initial Visit)     Gary Ho is a 57 y.o. male.  HPI: Patient presents for evaluation of cholelithiasis.  Starting last fall, he has had once monthly episodes of epigastric abdominal pain.  This seems to be associated with eating.  He also has occasional nausea and vomiting.  His most recent episode prompted him to be evaluated in the emergency department, at which time he was found to have cholelithiasis on CT without any associated inflammatory changes.  He had no elevation in his LFTs.  The pain at that time radiated into his back into the right side.  He denies any further episodes since being discharged home from the ED.  His past medical history significant for hypertension, diabetes, and BPH.  He denies use of blood thinning medications.  He denies history of abdominal surgeries.  He smokes 1 pack of cigarettes per day.  He denies use of alcohol and illicit drugs.  Past Medical History:  Diagnosis Date   Diabetes mellitus without complication ([place])    Hypertension    Sleep apnea     History reviewed. No pertinent surgical history.  History reviewed. No pertinent family history.  Social History   Tobacco Use   Smoking status: Every Day    Packs/day: 1.00    Years: 20.00    Additional pack years: 0.00    Total pack years: 20.00    Types: Cigarettes   Smokeless tobacco: Never  Vaping Use   Vaping Use: Never used  Substance Use Topics   Alcohol use: No   Drug use: No    Medications: I have reviewed the patient's current medications. Allergies as of 07/29/2022       Reactions   Poison Sumac Extract Rash        Medication List        Accurate as of July 29, 2022  3:20 PM. If you have any questions, ask your nurse or doctor.          STOP taking these medications    ondansetron 4 MG  disintegrating tablet Commonly known as: ZOFRAN-ODT Stopped by: Gionna Polak A Kennen Stammer, DO       TAKE these medications    amLODipine 10 MG tablet Commonly known as: NORVASC Take 1 tablet by mouth once daily for blood pressure   atorvastatin 20 MG tablet Commonly known as: LIPITOR Take 20 mg by mouth daily. What changed: Another medication with the same name was removed. Continue taking this medication, and follow the directions you see here. Changed by: Pavielle Biggar A Jaimie Redditt, DO   HYDROcodone-acetaminophen 5-325 MG tablet Commonly known as: NORCO/VICODIN Take 1 tablet by mouth every 6 (six) hours as needed for severe pain.   losartan 100 MG tablet Commonly known as: COZAAR Take 100 mg by mouth daily.   metoprolol succinate 25 MG 24 hr tablet Commonly known as: TOPROL-XL Take 25 mg by mouth daily.   pioglitazone 30 MG tablet Commonly known as: ACTOS Take 30 mg by mouth daily.   tamsulosin 0.4 MG Caps capsule Commonly known as: FLOMAX Take 1 capsule (0.4 mg total) by mouth daily after supper.   triamterene-hydrochlorothiazide 37.5-25 MG tablet Commonly known as: MAXZIDE-25 TAKE 1 TABLET BY MOUTH ONCE DAILY EVERY MORNING         ROS:  Constitutional: negative for chills, fatigue, and fevers Eyes: negative for visual disturbance and  pain Ears, nose, mouth, throat, and face: negative for ear drainage, sore throat, and sinus problems Respiratory: positive for wheezing and shortness of breath, negative for cough Cardiovascular: negative for chest pain and palpitations Gastrointestinal: positive for abdominal pain, nausea, and vomiting Genitourinary:positive for frequency and urinary retention Integument/breast: positive for dryness Hematologic/lymphatic: negative for bleeding and lymphadenopathy Musculoskeletal:positive for back pain, negative for neck pain Neurological: negative for dizziness and tremors Endocrine: negative for temperature intolerance  Blood  pressure 111/72, pulse (!) 52, temperature 98.2 F (36.8 C), temperature source Oral, resp. rate 18, height 5\' 11"  (1.803 m), weight 245 lb (111.1 kg), SpO2 93 %. Physical Exam Vitals reviewed.  Constitutional:      Appearance: Normal appearance.  HENT:     Head: Normocephalic and atraumatic.  Eyes:     Extraocular Movements: Extraocular movements intact.     Pupils: Pupils are equal, round, and reactive to light.  Cardiovascular:     Rate and Rhythm: Normal rate and regular rhythm.  Pulmonary:     Effort: Pulmonary effort is normal.     Breath sounds: Normal breath sounds.  Abdominal:     Comments: Abdomen soft, nondistended, no percussion tenderness, nontender to palpation; no rigidity, guarding, rebound tenderness; negative Murphy sign; soft and reducible 1 cm umbilical hernia  Musculoskeletal:        General: Normal range of motion.     Cervical back: Normal range of motion.  Skin:    General: Skin is warm and dry.  Neurological:     General: No focal deficit present.     Mental Status: He is alert and oriented to person, place, and time.  Psychiatric:        Mood and Affect: Mood normal.        Behavior: Behavior normal.     Results: CT abdomen and pelvis (07/21/2022): IMPRESSION: 1. Cholelithiasis. 2. Hepatic steatosis. 3. Diverticulosis without diverticulitis. 4. Stable 5 mm subpleural nodule in the left lower lobe. No follow-up needed if patient is low-risk.This recommendation follows the consensus statement: Guidelines for Management of Incidental Pulmonary Nodules Detected on CT Images: From the Fleischner Society 2017; Radiology 2017; 284:228-243. 5. Remaining incidental findings as described above.   Assessment & Plan:  Gary Ho is a 57 y.o. male who presents for evaluation of his cholelithiasis.  -I explained the pathophysiology of gallbladder disease, and why we recommend surgical excision -I counseled the patient about the indication, risks and  benefits of robotic assisted laparoscopic cholecystectomy.  He understands there is a very small chance for bleeding, infection, injury to normal structures (including common bile duct), conversion to open surgery, persistent symptoms, evolution of postcholecystectomy diarrhea, need for secondary interventions, anesthesia reaction, cardiopulmonary issues and other risks not specifically detailed here. I described the expected recovery, the plan for follow-up and the restrictions during the recovery phase.  All questions were answered. -Patient also requested that we repair his umbilical hernia at the time of the surgery.  I explained that we can use the hernia site as a port location.  Since we are also removing gallbladder at the same time, I am recommending against mesh placement, which puts him at slightly increased risk for the hernia recurring.  Patient is agreeable to primary repair of his umbilical hernia at the time of cholecystectomy -Patient tentatively scheduled for surgery on 4/4 -Information provided to the patient regarding cholelithiasis, cholecystectomy, and low-fat diet -Advised patient to present to the emergency department if he begins to have fever, chills,  worsening abdominal pain, nausea, or vomiting  All questions were answered to the satisfaction of the patient and family.  Graciella Freer, DO [facility] [address], [place] [address] [phone] (office)

## 2022-07-31 ENCOUNTER — Encounter: Payer: Self-pay | Admitting: *Deleted

## 2022-08-04 NOTE — Patient Instructions (Signed)
Gary Ho  08/04/2022     @PREFPERIOPPHARMACY @   Your procedure is scheduled on  08/07/2022.   Report to [name] at  May.M.   Call this number if you have problems the morning of surgery:  [phone]  If you experience any cold or flu symptoms such as cough, fever, chills, shortness of breath, etc. between now and your scheduled surgery, please notify us at the above number.   Remember:  Do not eat or drink after midnight.       DO NOT take any medications for diabetes the morning of your procedure.     Take these medicines the morning of surgery with A SIP OF WATER        amlodipine, metoprolol, zofran (if needed), flomax.     Do not wear jewelry, make-up or nail polish.  Do not wear lotions, powders, or perfumes, or deodorant.  Do not shave 48 hours prior to surgery.  Men may shave face and neck.  Do not bring valuables to the hospital.  [facility] is not responsible for any belongings or valuables.  Contacts, dentures or bridgework may not be worn into surgery.  Leave your suitcase in the car.  After surgery it may be brought to your room.  For patients admitted to the hospital, discharge time will be determined by your treatment team.  Patients discharged the day of surgery will not be allowed to drive home and must have someone with them for 24 hours.    Special instructions:   DO NOT smoke tobacco or vape for 24 hours before your procedure.  Please read over the following fact sheets that you were given. Pain Booklet, Coughing and Deep Breathing, Surgical Site Infection Prevention, Anesthesia Post-op Instructions, and Care and Recovery After Surgery        Laparoscopic Ventral Hernia Repair, Care After The following information offers guidance on how to care for yourself after your procedure. Your health care provider may also give you more specific instructions. If you have problems or questions, contact your health care  provider. What can I expect after the procedure? After the procedure, it is common to have pain, discomfort, or soreness. Follow these instructions at home: Medicines Take over-the-counter and prescription medicines only as told by your health care provider. Ask your health care provider if the medicine prescribed to you: Requires you to avoid driving or using machinery. Can cause constipation. You may need to take these actions to prevent or treat constipation: Drink enough fluid to keep your urine pale yellow. Take over-the-counter or prescription medicines. Eat foods that are high in fiber, such as beans, whole grains, and fresh fruits and vegetables. Limit foods that are high in fat and processed sugars, such as fried or sweet foods. Incision care  Follow instructions from your health care provider about how to take care of your incisions. Make sure you: Wash your hands with soap and water for at least 20 seconds before and after you change your bandage (dressing) or before you touch your abdomen. If soap and water are not available, use hand sanitizer. Change your dressing as told by your health care provider. Leave stitches (sutures), skin glue, or adhesive strips in place. These skin closures may need to stay in place for 2 weeks or longer. If adhesive strip edges start to loosen and curl up, you may trim the loose edges. Do not remove adhesive strips completely unless your  health care provider tells you to do that. Check your incision areas every day for signs of infection. Check for: More redness, swelling, or pain. Fluid or blood. Warmth. Pus or a bad smell. Bathing  Do not take baths, swim, or use a hot tub until your health care provider approves. Ask your health care provider if you may take showers. You may only be allowed to take sponge baths. Keep your dressing dry until your health care provider says it can be removed. Activity  Rest as told by your health care  provider. Avoid sitting for a long time without moving. Get up to take short walks every 1-2 hours. This is important to improve blood flow and breathing. Ask for help if you feel weak or unsteady. Do not lift anything that is heavier than 10 lb (4.5 kg), or the limit that you are told, until your health care provider says that it is safe. If you were given a sedative during the procedure, it can affect you for several hours. Do not drive or operate machinery until your health care provider says that it is safe. Return to your normal activities as told by your health care provider. Ask your health care provider what activities are safe for you. General instructions  Hold a pillow over your abdomen when you cough or sneeze. This helps with pain. Do not use any products that contain nicotine or tobacco. These products include cigarettes, chewing tobacco, and vaping devices, such as e-cigarettes. These can delay healing after surgery. If you need help quitting, ask your health care provider. You may be asked to continue to do deep breathing exercises at home. This will help to prevent a lung infection. Keep all follow-up visits. This is important. Contact a health care provider if: You have any of these signs of infection: More redness, swelling, or pain around an incision. Fluid or blood coming from an incision. Warmth coming from an incision. Pus or a bad smell coming from an incision. A fever or chills. You have pain that gets worse or does not get better with medicine. You have nausea or vomiting. You have a cough. You have shortness of breath. You have not had a bowel movement in 3 days. You are not able to urinate. Get help right away if you have: Severe pain in your abdomen. Persistent nausea and vomiting. Redness, warmth, or pain in your leg. Chest pain. Trouble breathing. These symptoms may represent a serious problem that is an emergency. Do not wait to see if the symptoms will  go away. Get medical help right away. Call your local emergency services (911 in the [place] Do not drive yourself to the hospital. Summary After this procedure, it is common to have pain, discomfort, or soreness. Follow instructions from your health care provider about how to take care of your incision. Check your incision area every day for signs of infection. Report any signs of infection to your health care provider. Keep all follow-up visits. This is important. This information is not intended to replace advice given to you by your health care provider. Make sure you discuss any questions you have with your health care provider. Document Revised: 12/09/2019 Document Reviewed: 12/09/2019 Elsevier Patient Education  [place]. Minimally Invasive Cholecystectomy, Care After The following information offers guidance on how to care for yourself after your procedure. Your health care provider may also give you more specific instructions. If you have problems or questions, contact your health care provider. What can  I expect after the procedure? After the procedure, it is common to have: Pain at your incision sites. You will be given medicines to control this pain. Mild nausea or vomiting. Bloating and possible shoulder pain from the gas that was used during the procedure. Follow these instructions at home: Medicines Take over-the-counter and prescription medicines only as told by your health care provider. If you were prescribed an antibiotic medicine, take it as told by your health care provider. Do not stop using the antibiotic even if you start to feel better. Ask your health care provider if the medicine prescribed to you: Requires you to avoid driving or using machinery. Can cause constipation. You may need to take these actions to prevent or treat constipation: Drink enough fluid to keep your urine pale yellow. Take over-the-counter or prescription medicines. Eat foods that are  high in fiber, such as beans, whole grains, and fresh fruits and vegetables. Limit foods that are high in fat and processed sugars, such as fried or sweet foods. Incision care  Follow instructions from your health care provider about how to take care of your incisions. Make sure you: Wash your hands with soap and water for at least 20 seconds before and after you change your bandage (dressing). If soap and water are not available, use hand sanitizer. Change your dressing as told by your health care provider. Leave stitches (sutures), skin glue, or adhesive strips in place. These skin closures may need to be in place for 2 weeks or longer. If adhesive strip edges start to loosen and curl up, you may trim the loose edges. Do not remove adhesive strips completely unless your health care provider tells you to do that. Do not take baths, swim, or use a hot tub until your health care provider approves. Ask your health care provider if you may take showers. You may only be allowed to take sponge baths. Check your incision area every day for signs of infection. Check for: More redness, swelling, or pain. Fluid or blood. Warmth. Pus or a bad smell. Activity Rest as told by your health care provider. Do not do activities that require a lot of effort. Avoid sitting for a long time without moving. Get up to take short walks every 1-2 hours. This is important to improve blood flow and breathing. Ask for help if you feel weak or unsteady. Do not lift anything that is heavier than 10 lb (4.5 kg), or the limit that you are told, until your health care provider says that it is safe. Do not play contact sports until your health care provider approves. Do not return to work or school until your health care provider approves. Return to your normal activities as told by your health care provider. Ask your health care provider what activities are safe for you. General instructions If you were given a sedative during  the procedure, it can affect you for several hours. Do not drive or operate machinery until your health care provider says that it is safe. Keep all follow-up visits. This is important. Contact a health care provider if: You develop a rash. You have more redness, swelling, or pain around your incisions. You have fluid or blood coming from your incisions. Your incisions feel warm to the touch. You have pus or a bad smell coming from your incisions. You have a fever. One or more of your incisions breaks open. Get help right away if: You have trouble breathing. You have chest pain. You have more  pain in your shoulders. You faint or feel dizzy when you stand. You have severe pain in your abdomen. You have nausea or vomiting that lasts for more than one day. You have leg pain that is new or unusual, or if it is localized to one specific spot. These symptoms may represent a serious problem that is an emergency. Do not wait to see if the symptoms will go away. Get medical help right away. Call your local emergency services (911 in the [place] Do not drive yourself to the hospital. Summary After your procedure, it is common to have pain at the incision sites. You may also have nausea or bloating. Follow your health care provider's instructions about medicine, activity restrictions, and caring for your incision areas. Do not do activities that require a lot of effort. Contact a health care provider if you have a fever or other signs of infection, such as more redness, swelling, or pain around the incisions. Get help right away if you have chest pain, increasing pain in the shoulders, or trouble breathing. This information is not intended to replace advice given to you by your health care provider. Make sure you discuss any questions you have with your health care provider. Document Revised: 10/23/2020 Document Reviewed: 10/23/2020 Elsevier Patient Education  [place] Anesthesia,  Adult, Care After The following information offers guidance on how to care for yourself after your procedure. Your health care provider may also give you more specific instructions. If you have problems or questions, contact your health care provider. What can I expect after the procedure? After the procedure, it is common for people to: Have pain or discomfort at the IV site. Have nausea or vomiting. Have a sore throat or hoarseness. Have trouble concentrating. Feel cold or chills. Feel weak, sleepy, or tired (fatigue). Have soreness and body aches. These can affect parts of the body that were not involved in surgery. Follow these instructions at home: For the time period you were told by your health care provider:  Rest. Do not participate in activities where you could fall or become injured. Do not drive or use machinery. Do not drink alcohol. Do not take sleeping pills or medicines that cause drowsiness. Do not make important decisions or sign legal documents. Do not take care of children on your own. General instructions Drink enough fluid to keep your urine pale yellow. If you have sleep apnea, surgery and certain medicines can increase your risk for breathing problems. Follow instructions from your health care provider about wearing your sleep device: Anytime you are sleeping, including during daytime naps. While taking prescription pain medicines, sleeping medicines, or medicines that make you drowsy. Return to your normal activities as told by your health care provider. Ask your health care provider what activities are safe for you. Take over-the-counter and prescription medicines only as told by your health care provider. Do not use any products that contain nicotine or tobacco. These products include cigarettes, chewing tobacco, and vaping devices, such as e-cigarettes. These can delay incision healing after surgery. If you need help quitting, ask your health care  provider. Contact a health care provider if: You have nausea or vomiting that does not get better with medicine. You vomit every time you eat or drink. You have pain that does not get better with medicine. You cannot urinate or have bloody urine. You develop a skin rash. You have a fever. Get help right away if: You have trouble breathing. You have chest pain.  You vomit blood. These symptoms may be an emergency. Get help right away. Call 911. Do not wait to see if the symptoms will go away. Do not drive yourself to the hospital. Summary After the procedure, it is common to have a sore throat, hoarseness, nausea, vomiting, or to feel weak, sleepy, or fatigue. For the time period you were told by your health care provider, do not drive or use machinery. Get help right away if you have difficulty breathing, have chest pain, or vomit blood. These symptoms may be an emergency. This information is not intended to replace advice given to you by your health care provider. Make sure you discuss any questions you have with your health care provider. Document Revised: 07/19/2021 Document Reviewed: 07/19/2021 Elsevier Patient Education  [place]. How to Use Chlorhexidine Before Surgery Chlorhexidine gluconate (CHG) is a germ-killing (antiseptic) solution that is used to clean the skin. It can get rid of the bacteria that normally live on the skin and can keep them away for about 24 hours. To clean your skin with CHG, you may be given: A CHG solution to use in the shower or as part of a sponge bath. A prepackaged cloth that contains CHG. Cleaning your skin with CHG may help lower the risk for infection: While you are staying in the intensive care unit of the hospital. If you have a vascular access, such as a central line, to provide short-term or long-term access to your veins. If you have a catheter to drain urine from your bladder. If you are on a ventilator. A ventilator is a machine  that helps you breathe by moving air in and out of your lungs. After surgery. What are the risks? Risks of using CHG include: A skin reaction. Hearing loss, if CHG gets in your ears and you have a perforated eardrum. Eye injury, if CHG gets in your eyes and is not rinsed out. The CHG product catching fire. Make sure that you avoid smoking and flames after applying CHG to your skin. Do not use CHG: If you have a chlorhexidine allergy or have previously reacted to chlorhexidine. On babies younger than 68 months of age. How to use CHG solution Use CHG only as told by your health care provider, and follow the instructions on the label. Use the full amount of CHG as directed. Usually, this is one bottle. During a shower Follow these steps when using CHG solution during a shower (unless your health care provider gives you different instructions): Start the shower. Use your normal soap and shampoo to wash your face and hair. Turn off the shower or move out of the shower stream. Pour the CHG onto a clean washcloth. Do not use any type of brush or rough-edged sponge. Starting at your neck, lather your body down to your toes. Make sure you follow these instructions: If you will be having surgery, pay special attention to the part of your body where you will be having surgery. Scrub this area for at least 1 minute. Do not use CHG on your head or face. If the solution gets into your ears or eyes, rinse them well with water. Avoid your genital area. Avoid any areas of skin that have broken skin, cuts, or scrapes. Scrub your back and under your arms. Make sure to wash skin folds. Let the lather sit on your skin for 1-2 minutes or as long as told by your health care provider. Thoroughly rinse your entire body in the shower.  Make sure that all body creases and crevices are rinsed well. Dry off with a clean towel. Do not put any substances on your body afterward--such as powder, lotion, or perfume--unless  you are told to do so by your health care provider. Only use lotions that are recommended by the manufacturer. Put on clean clothes or pajamas. If it is the night before your surgery, sleep in clean sheets.  During a sponge bath Follow these steps when using CHG solution during a sponge bath (unless your health care provider gives you different instructions): Use your normal soap and shampoo to wash your face and hair. Pour the CHG onto a clean washcloth. Starting at your neck, lather your body down to your toes. Make sure you follow these instructions: If you will be having surgery, pay special attention to the part of your body where you will be having surgery. Scrub this area for at least 1 minute. Do not use CHG on your head or face. If the solution gets into your ears or eyes, rinse them well with water. Avoid your genital area. Avoid any areas of skin that have broken skin, cuts, or scrapes. Scrub your back and under your arms. Make sure to wash skin folds. Let the lather sit on your skin for 1-2 minutes or as long as told by your health care provider. Using a different clean, wet washcloth, thoroughly rinse your entire body. Make sure that all body creases and crevices are rinsed well. Dry off with a clean towel. Do not put any substances on your body afterward--such as powder, lotion, or perfume--unless you are told to do so by your health care provider. Only use lotions that are recommended by the manufacturer. Put on clean clothes or pajamas. If it is the night before your surgery, sleep in clean sheets. How to use CHG prepackaged cloths Only use CHG cloths as told by your health care provider, and follow the instructions on the label. Use the CHG cloth on clean, dry skin. Do not use the CHG cloth on your head or face unless your health care provider tells you to. When washing with the CHG cloth: Avoid your genital area. Avoid any areas of skin that have broken skin, cuts, or  scrapes. Before surgery Follow these steps when using a CHG cloth to clean before surgery (unless your health care provider gives you different instructions): Using the CHG cloth, vigorously scrub the part of your body where you will be having surgery. Scrub using a back-and-forth motion for 3 minutes. The area on your body should be completely wet with CHG when you are done scrubbing. Do not rinse. Discard the cloth and let the area air-dry. Do not put any substances on the area afterward, such as powder, lotion, or perfume. Put on clean clothes or pajamas. If it is the night before your surgery, sleep in clean sheets.  For general bathing Follow these steps when using CHG cloths for general bathing (unless your health care provider gives you different instructions). Use a separate CHG cloth for each area of your body. Make sure you wash between any folds of skin and between your fingers and toes. Wash your body in the following order, switching to a new cloth after each step: The front of your neck, shoulders, and chest. Both of your arms, under your arms, and your hands. Your stomach and groin area, avoiding the genitals. Your right leg and foot. Your left leg and foot. The back of your neck, your  back, and your buttocks. Do not rinse. Discard the cloth and let the area air-dry. Do not put any substances on your body afterward--such as powder, lotion, or perfume--unless you are told to do so by your health care provider. Only use lotions that are recommended by the manufacturer. Put on clean clothes or pajamas. Contact a health care provider if: Your skin gets irritated after scrubbing. You have questions about using your solution or cloth. You swallow any chlorhexidine. Call your local poison control center (1-415-333-5744 in the [place] Get help right away if: Your eyes itch badly, or they become very red or swollen. Your skin itches badly and is red or swollen. Your hearing  changes. You have trouble seeing. You have swelling or tingling in your mouth or throat. You have trouble breathing. These symptoms may represent a serious problem that is an emergency. Do not wait to see if the symptoms will go away. Get medical help right away. Call your local emergency services (911 in the [place] Do not drive yourself to the hospital. Summary Chlorhexidine gluconate (CHG) is a germ-killing (antiseptic) solution that is used to clean the skin. Cleaning your skin with CHG may help to lower your risk for infection. You may be given CHG to use for bathing. It may be in a bottle or in a prepackaged cloth to use on your skin. Carefully follow your health care provider's instructions and the instructions on the product label. Do not use CHG if you have a chlorhexidine allergy. Contact your health care provider if your skin gets irritated after scrubbing. This information is not intended to replace advice given to you by your health care provider. Make sure you discuss any questions you have with your health care provider. Document Revised: 08/19/2021 Document Reviewed: 07/02/2020 Elsevier Patient Education  [place].

## 2022-08-05 ENCOUNTER — Encounter (HOSPITAL_COMMUNITY): Payer: Self-pay

## 2022-08-05 ENCOUNTER — Encounter (HOSPITAL_COMMUNITY)
Admission: RE | Admit: 2022-08-05 | Discharge: 2022-08-05 | Disposition: A | Discharge status: Home / Self Care | Payer: 59 | Source: Ambulatory Visit | Attending: Surgery | Admitting: Surgery

## 2022-08-05 VITALS — BP 96/76 | HR 75 | Temp 98.2°F | Resp 18 | Ht 71.0 in | Wt 245.0 lb

## 2022-08-05 DIAGNOSIS — E119 Type 2 diabetes mellitus without complications: Secondary | ICD-10-CM | POA: Insufficient documentation

## 2022-08-05 DIAGNOSIS — Z01812 Encounter for preprocedural laboratory examination: Secondary | ICD-10-CM | POA: Diagnosis present

## 2022-08-05 HISTORY — DX: Unspecified asthma, uncomplicated: J45.909

## 2022-08-07 ENCOUNTER — Ambulatory Visit (HOSPITAL_COMMUNITY): Payer: 59 | Admitting: Anesthesiology

## 2022-08-07 ENCOUNTER — Ambulatory Visit (HOSPITAL_COMMUNITY)
Admission: RE | Admit: 2022-08-07 | Discharge: 2022-08-07 | Disposition: A | Discharge status: Home / Self Care | Payer: 59 | Source: Ambulatory Visit | Attending: Surgery | Admitting: Surgery

## 2022-08-07 ENCOUNTER — Encounter (HOSPITAL_COMMUNITY)
Admission: RE | Disposition: A | Discharge status: Home / Self Care | Payer: Self-pay | Source: Ambulatory Visit | Attending: Surgery

## 2022-08-07 ENCOUNTER — Ambulatory Visit (HOSPITAL_BASED_OUTPATIENT_CLINIC_OR_DEPARTMENT_OTHER): Payer: 59 | Admitting: Anesthesiology

## 2022-08-07 ENCOUNTER — Other Ambulatory Visit: Payer: Self-pay

## 2022-08-07 ENCOUNTER — Encounter (HOSPITAL_COMMUNITY): Payer: Self-pay | Admitting: Surgery

## 2022-08-07 DIAGNOSIS — I10 Essential (primary) hypertension: Secondary | ICD-10-CM | POA: Insufficient documentation

## 2022-08-07 DIAGNOSIS — E119 Type 2 diabetes mellitus without complications: Secondary | ICD-10-CM | POA: Insufficient documentation

## 2022-08-07 DIAGNOSIS — Z7984 Long term (current) use of oral hypoglycemic drugs: Secondary | ICD-10-CM | POA: Insufficient documentation

## 2022-08-07 DIAGNOSIS — K429 Umbilical hernia without obstruction or gangrene: Secondary | ICD-10-CM | POA: Diagnosis not present

## 2022-08-07 DIAGNOSIS — F17210 Nicotine dependence, cigarettes, uncomplicated: Secondary | ICD-10-CM | POA: Diagnosis not present

## 2022-08-07 DIAGNOSIS — N400 Enlarged prostate without lower urinary tract symptoms: Secondary | ICD-10-CM | POA: Insufficient documentation

## 2022-08-07 DIAGNOSIS — G4730 Sleep apnea, unspecified: Secondary | ICD-10-CM | POA: Diagnosis not present

## 2022-08-07 DIAGNOSIS — K8020 Calculus of gallbladder without cholecystitis without obstruction: Secondary | ICD-10-CM

## 2022-08-07 DIAGNOSIS — K811 Chronic cholecystitis: Secondary | ICD-10-CM | POA: Insufficient documentation

## 2022-08-07 HISTORY — PX: UMBILICAL HERNIA REPAIR: SHX196

## 2022-08-07 LAB — HEMOGLOBIN A1C
Hgb A1c MFr Bld: 7 % — ABNORMAL HIGH (ref 4.8–5.6)
Mean Plasma Glucose: 154 mg/dL

## 2022-08-07 LAB — GLUCOSE, CAPILLARY
Glucose-Capillary: 136 mg/dL — ABNORMAL HIGH (ref 70–99)
Glucose-Capillary: 136 mg/dL — ABNORMAL HIGH (ref 70–99)

## 2022-08-07 SURGERY — CHOLECYSTECTOMY, ROBOT-ASSISTED, LAPAROSCOPIC
Anesthesia: General | Site: Abdomen

## 2022-08-07 MED ORDER — PROPOFOL 10 MG/ML IV BOLUS
INTRAVENOUS | Status: AC
  Filled 2022-08-07: qty 20

## 2022-08-07 MED ORDER — STERILE WATER FOR IRRIGATION IR SOLN
Status: DC | PRN
  Administered 2022-08-07: 500 mL

## 2022-08-07 MED ORDER — SODIUM CHLORIDE 0.9 % IV SOLN
2.0000 g | INTRAVENOUS | Status: AC
  Administered 2022-08-07: 2 g via INTRAVENOUS
  Filled 2022-08-07: qty 2

## 2022-08-07 MED ORDER — BUPIVACAINE HCL (PF) 0.5 % IJ SOLN
INTRAMUSCULAR | Status: DC | PRN
  Administered 2022-08-07: 30 mL

## 2022-08-07 MED ORDER — PROPOFOL 10 MG/ML IV BOLUS
INTRAVENOUS | Status: DC | PRN
  Administered 2022-08-07: 200 mg via INTRAVENOUS

## 2022-08-07 MED ORDER — FENTANYL CITRATE PF 50 MCG/ML IJ SOSY
25.0000 ug | PREFILLED_SYRINGE | INTRAMUSCULAR | Status: DC | PRN
  Administered 2022-08-07 (×2): 50 ug via INTRAVENOUS
  Filled 2022-08-07: qty 1

## 2022-08-07 MED ORDER — SUCCINYLCHOLINE CHLORIDE 200 MG/10ML IV SOSY
PREFILLED_SYRINGE | INTRAVENOUS | Status: DC | PRN
  Administered 2022-08-07: 120 mg via INTRAVENOUS

## 2022-08-07 MED ORDER — LACTATED RINGERS IV SOLN: INTRAVENOUS | Status: DC

## 2022-08-07 MED ORDER — FENTANYL CITRATE PF 50 MCG/ML IJ SOSY
PREFILLED_SYRINGE | INTRAMUSCULAR | Status: AC
  Filled 2022-08-07: qty 1

## 2022-08-07 MED ORDER — LIDOCAINE 2% (20 MG/ML) 5 ML SYRINGE
INTRAMUSCULAR | Status: DC | PRN
  Administered 2022-08-07: 100 mg via INTRAVENOUS

## 2022-08-07 MED ORDER — CHLORHEXIDINE GLUCONATE CLOTH 2 % EX PADS: 6.0000 | MEDICATED_PAD | Freq: Once | CUTANEOUS | Status: DC

## 2022-08-07 MED ORDER — ONDANSETRON HCL 4 MG/2ML IJ SOLN: 4.0000 mg | Freq: Once | INTRAMUSCULAR | Status: DC | PRN

## 2022-08-07 MED ORDER — SUGAMMADEX SODIUM 200 MG/2ML IV SOLN
INTRAVENOUS | Status: DC | PRN
  Administered 2022-08-07: 300 mg via INTRAVENOUS

## 2022-08-07 MED ORDER — DEXAMETHASONE SODIUM PHOSPHATE 10 MG/ML IJ SOLN
INTRAMUSCULAR | Status: DC | PRN
  Administered 2022-08-07: 10 mg via INTRAVENOUS

## 2022-08-07 MED ORDER — MIDAZOLAM HCL 2 MG/2ML IJ SOLN
INTRAMUSCULAR | Status: AC
  Filled 2022-08-07: qty 2

## 2022-08-07 MED ORDER — HYDROCODONE-ACETAMINOPHEN 5-325 MG PO TABS: 1.0000 | ORAL_TABLET | Freq: Four times a day (QID) | ORAL | 0 refills | Status: AC | PRN

## 2022-08-07 MED ORDER — CHLORHEXIDINE GLUCONATE 0.12 % MT SOLN
15.0000 mL | Freq: Once | OROMUCOSAL | Status: AC
  Administered 2022-08-07: 15 mL via OROMUCOSAL

## 2022-08-07 MED ORDER — HEMOSTATIC AGENTS (NO CHARGE) OPTIME
TOPICAL | Status: DC | PRN
  Administered 2022-08-07: 2

## 2022-08-07 MED ORDER — MIDAZOLAM HCL 5 MG/5ML IJ SOLN
INTRAMUSCULAR | Status: DC | PRN
  Administered 2022-08-07: 2 mg via INTRAVENOUS

## 2022-08-07 MED ORDER — DEXAMETHASONE SODIUM PHOSPHATE 10 MG/ML IJ SOLN
INTRAMUSCULAR | Status: AC
  Filled 2022-08-07: qty 1

## 2022-08-07 MED ORDER — DOCUSATE SODIUM 100 MG PO CAPS: 100.0000 mg | ORAL_CAPSULE | Freq: Two times a day (BID) | ORAL | 2 refills | Status: AC

## 2022-08-07 MED ORDER — PHENYLEPHRINE 80 MCG/ML (10ML) SYRINGE FOR IV PUSH (FOR BLOOD PRESSURE SUPPORT)
PREFILLED_SYRINGE | INTRAVENOUS | Status: DC | PRN
  Administered 2022-08-07 (×2): 80 ug via INTRAVENOUS

## 2022-08-07 MED ORDER — INDOCYANINE GREEN 25 MG IV SOLR: 2.5000 mg | Freq: Once | INTRAVENOUS | Status: AC

## 2022-08-07 MED ORDER — OXYCODONE HCL 5 MG/5ML PO SOLN: 5.0000 mg | Freq: Once | ORAL | Status: AC | PRN

## 2022-08-07 MED ORDER — FENTANYL CITRATE (PF) 100 MCG/2ML IJ SOLN
INTRAMUSCULAR | Status: DC | PRN
  Administered 2022-08-07: 100 ug via INTRAVENOUS
  Administered 2022-08-07 (×2): 50 ug via INTRAVENOUS

## 2022-08-07 MED ORDER — ONDANSETRON HCL 4 MG/2ML IJ SOLN
INTRAMUSCULAR | Status: DC | PRN
  Administered 2022-08-07: 4 mg via INTRAVENOUS

## 2022-08-07 MED ORDER — INDOCYANINE GREEN 25 MG IV SOLR
INTRAVENOUS | Status: AC
  Administered 2022-08-07: 2.5 mg via INTRAVENOUS
  Filled 2022-08-07: qty 10

## 2022-08-07 MED ORDER — SODIUM CHLORIDE 0.9 % IR SOLN
Status: DC | PRN
  Administered 2022-08-07: 3000 mL

## 2022-08-07 MED ORDER — BUPIVACAINE HCL (PF) 0.5 % IJ SOLN
INTRAMUSCULAR | Status: AC
  Filled 2022-08-07: qty 30

## 2022-08-07 MED ORDER — FENTANYL CITRATE (PF) 250 MCG/5ML IJ SOLN
INTRAMUSCULAR | Status: AC
  Filled 2022-08-07: qty 5

## 2022-08-07 MED ORDER — OXYCODONE HCL 5 MG PO TABS
5.0000 mg | ORAL_TABLET | Freq: Once | ORAL | Status: AC | PRN
  Administered 2022-08-07: 5 mg via ORAL
  Filled 2022-08-07: qty 1

## 2022-08-07 MED ORDER — ROCURONIUM BROMIDE 100 MG/10ML IV SOLN
INTRAVENOUS | Status: DC | PRN
  Administered 2022-08-07: 60 mg via INTRAVENOUS
  Administered 2022-08-07: 10 mg via INTRAVENOUS

## 2022-08-07 MED ORDER — ONDANSETRON HCL 4 MG/2ML IJ SOLN
INTRAMUSCULAR | Status: AC
  Filled 2022-08-07: qty 2

## 2022-08-07 MED ORDER — ACETAMINOPHEN 500 MG PO TABS: 500.0000 mg | ORAL_TABLET | Freq: Four times a day (QID) | ORAL | 0 refills | Status: AC

## 2022-08-07 MED ORDER — ORAL CARE MOUTH RINSE: 15.0000 mL | Freq: Once | OROMUCOSAL | Status: AC

## 2022-08-07 SURGICAL SUPPLY — 59 items
ADH SKN CLS APL DERMABOND .7 (GAUZE/BANDAGES/DRESSINGS) ×2
APL PRP STRL LF DISP 70% ISPRP (MISCELLANEOUS) ×2
BLADE SURG 15 STRL LF DISP TIS (BLADE) ×2 IMPLANT
BLADE SURG 15 STRL SS (BLADE) ×2
CAUTERY HOOK 1.6MM DA VINCI XI (INSTRUMENTS) ×2
CAUTERY HOOK MNPLR 1.6 DVNC XI (INSTRUMENTS) ×2 IMPLANT
CHLORAPREP W/TINT 26 (MISCELLANEOUS) ×2 IMPLANT
CLIP LIGATING HEM O LOK PURPLE (MISCELLANEOUS) ×2 IMPLANT
COVER LIGHT HANDLE STERIS (MISCELLANEOUS) ×4 IMPLANT
COVER MAYO STAND XLG (MISCELLANEOUS) ×1 IMPLANT
COVER TIP SHEARS 8 DVNC (MISCELLANEOUS) ×2 IMPLANT
COVER TIP SHEARS 8MM DA VINCI (MISCELLANEOUS) ×2
DEFOGGER SCOPE WARMER CLEARIFY (MISCELLANEOUS) ×1 IMPLANT
DERMABOND ADVANCED .7 DNX12 (GAUZE/BANDAGES/DRESSINGS) ×2 IMPLANT
DRAPE ARM DVNC X/XI (DISPOSABLE) ×8 IMPLANT
DRAPE COLUMN DVNC XI (DISPOSABLE) ×2 IMPLANT
DRAPE DA VINCI XI ARM (DISPOSABLE) ×8
DRAPE DA VINCI XI COLUMN (DISPOSABLE) ×2
DRSG TEGADERM 4X4.75 (GAUZE/BANDAGES/DRESSINGS) ×2 IMPLANT
ELECT REM PT RETURN 9FT ADLT (ELECTROSURGICAL) ×2
ELECTRODE REM PT RTRN 9FT ADLT (ELECTROSURGICAL) ×2 IMPLANT
FORCEPS PROGRASP DVNC XI (FORCEP) ×2 IMPLANT
FORCEPS XI PROGRASP DA VINCI (FORCEP) ×2
GAUZE SPONGE 4X4 12PLY STRL (GAUZE/BANDAGES/DRESSINGS) ×1 IMPLANT
GAUZE XEROFORM 1X8 LF (GAUZE/BANDAGES/DRESSINGS) ×1 IMPLANT
GLOVE BIO SURGEON STRL SZ7 (GLOVE) ×1 IMPLANT
GLOVE BIOGEL PI IND STRL 6.5 (GLOVE) ×4 IMPLANT
GLOVE BIOGEL PI IND STRL 7.0 (GLOVE) ×5 IMPLANT
GLOVE SURG SS PI 6.5 STRL IVOR (GLOVE) ×4 IMPLANT
GOWN STRL REUS W/TWL LRG LVL3 (GOWN DISPOSABLE) ×6 IMPLANT
GRASPER SUT TROCAR 14GX15 (MISCELLANEOUS) ×2 IMPLANT
HEMOSTAT SNOW SURGICEL 2X4 (HEMOSTASIS) ×2 IMPLANT
IRRIGATOR SUCT 8 DISP DVNC XI (IRRIGATION / IRRIGATOR) ×1 IMPLANT
IRRIGATOR SUCTION 8MM XI DISP (IRRIGATION / IRRIGATOR) ×2
IV NS IRRIG 3000ML ARTHROMATIC (IV SOLUTION) ×1 IMPLANT
KIT TURNOVER KIT A (KITS) ×2 IMPLANT
MANIFOLD NEPTUNE II (INSTRUMENTS) ×2 IMPLANT
NDL HYPO 21X1.5 SAFETY (NEEDLE) ×1 IMPLANT
NEEDLE HYPO 21X1.5 SAFETY (NEEDLE) ×2 IMPLANT
OBTURATOR OPTICAL STANDARD 8MM (TROCAR) ×2
OBTURATOR OPTICAL STND 8 DVNC (TROCAR) ×2
OBTURATOR OPTICALSTD 8 DVNC (TROCAR) ×2 IMPLANT
PACK LAP CHOLE LZT030E (CUSTOM PROCEDURE TRAY) ×2 IMPLANT
PAD ARMBOARD 7.5X6 YLW CONV (MISCELLANEOUS) ×2 IMPLANT
PENCIL HANDSWITCHING (ELECTRODE) ×2 IMPLANT
SCISSORS MNPLR CVD DVNC XI (INSTRUMENTS) ×2 IMPLANT
SCISSORS XI MNPLR CVD DVNC (INSTRUMENTS) ×2
SEAL CANN UNIV 5-8 DVNC XI (MISCELLANEOUS) ×7 IMPLANT
SEAL XI 5MM-8MM UNIVERSAL (MISCELLANEOUS) ×8
SET BASIN LINEN APH (SET/KITS/TRAYS/PACK) ×2 IMPLANT
SET TUBE SMOKE EVAC HIGH FLOW (TUBING) ×2 IMPLANT
SUT ETHIBOND NAB MO 7 #0 18IN (SUTURE) ×1 IMPLANT
SUT MNCRL AB 4-0 PS2 18 (SUTURE) ×4 IMPLANT
SUT VIC AB 3-0 SH 27 (SUTURE) ×4
SUT VIC AB 3-0 SH 27X BRD (SUTURE) ×2 IMPLANT
SYR 30ML LL (SYRINGE) ×2 IMPLANT
SYS RETRIEVAL 5MM INZII UNIV (BASKET) ×2
SYSTEM RETRIEVL 5MM INZII UNIV (BASKET) ×1 IMPLANT
WATER STERILE IRR 500ML POUR (IV SOLUTION) ×2 IMPLANT

## 2022-08-07 NOTE — Transfer of Care (Signed)
Immediate Anesthesia Transfer of Care Note  Patient: Gary Ho  Procedure(s) Performed: XI ROBOTIC ASSISTED LAPAROSCOPIC CHOLECYSTECTOMY (Abdomen) OPEN UMBILICAL HERNIA REPAIR (Abdomen)  Patient Location: PACU  Anesthesia Type:General  Level of Consciousness: awake  Airway & Oxygen Therapy: Patient Spontanous Breathing and Patient connected to face mask oxygen  Post-op Assessment: Report given to RN and Post -op Vital signs reviewed and stable  Post vital signs: Reviewed and stable  Last Vitals:  Vitals Value Taken Time  BP 118/75 08/07/22 1133  Temp    Pulse 81 08/07/22 1137  Resp 14 08/07/22 1137  SpO2 100 % 08/07/22 1137  Vitals shown include unvalidated device data.  Last Pain:  Vitals:   08/07/22 0642  TempSrc: Oral  PainSc: 0-No pain      Patients Stated Pain Goal: 5 ([id number])  Complications: No notable events documented.

## 2022-08-07 NOTE — Interval H&P Note (Signed)
History and Physical Interval Note:  08/07/2022 8:02 AM  Gary Ho  has presented today for surgery, with the diagnosis of Cholelithiasis.  The various methods of treatment have been discussed with the patient and family. After consideration of risks, benefits and other options for treatment, the patient has consented to  Procedure(s): XI ROBOTIC ASSISTED LAPAROSCOPIC CHOLECYSTECTOMY (N/A) XI ROBOT ASSISTED UMBILICAL HERNIA REPAIR W/ MESH (N/A) as a surgical intervention.  The patient's history has been reviewed, patient examined, no change in status, stable for surgery.  I have reviewed the patient's chart and labs.  Questions were answered to the patient's satisfaction.     [place]

## 2022-08-07 NOTE — Anesthesia Preprocedure Evaluation (Signed)
Anesthesia Evaluation  Patient identified by MRN, date of birth, ID band Patient awake    Reviewed: Allergy & Precautions, H&P , NPO status , Patient's Chart, lab work & pertinent test results, reviewed documented beta blocker date and time   Airway Mallampati: II  TM Distance: >3 FB Neck ROM: full    Dental no notable dental hx.    Pulmonary neg pulmonary ROS, asthma , sleep apnea , Current Smoker and Patient abstained from smoking.   Pulmonary exam normal breath sounds clear to auscultation       Cardiovascular Exercise Tolerance: Good hypertension, negative cardio ROS  Rhythm:regular Rate:Normal     Neuro/Psych negative neurological ROS  negative psych ROS   GI/Hepatic negative GI ROS, Neg liver ROS,,,  Endo/Other  negative endocrine ROSdiabetes    Renal/GU Renal diseasenegative Renal ROS  negative genitourinary   Musculoskeletal   Abdominal   Peds  Hematology negative hematology ROS (+)   Anesthesia Other Findings   Reproductive/Obstetrics negative OB ROS                             Anesthesia Physical Anesthesia Plan  ASA: 2  Anesthesia Plan: General and General ETT   Post-op Pain Management:    Induction:   PONV Risk Score and Plan: Ondansetron  Airway Management Planned:   Additional Equipment:   Intra-op Plan:   Post-operative Plan:   Informed Consent: I have reviewed the patients History and Physical, chart, labs and discussed the procedure including the risks, benefits and alternatives for the proposed anesthesia with the patient or authorized representative who has indicated his/her understanding and acceptance.     Dental Advisory Given  Plan Discussed with: CRNA  Anesthesia Plan Comments:        Anesthesia Quick Evaluation

## 2022-08-07 NOTE — Anesthesia Procedure Notes (Signed)
Procedure Name: Intubation Date/Time: 08/07/2022 8:36 AM  Performed by: Hewitt Blade, CRNAPre-anesthesia Checklist: Patient identified, Emergency Drugs available, Suction available and Patient being monitored Patient Re-evaluated:Patient Re-evaluated prior to induction Oxygen Delivery Method: Circle system utilized Preoxygenation: Pre-oxygenation with 100% oxygen Induction Type: IV induction Ventilation: Mask ventilation without difficulty Laryngoscope Size: Mac and 3 Grade View: Grade I Tube type: Oral Tube size: 7.5 mm Number of attempts: 1 Airway Equipment and Method: Stylet and Oral airway Placement Confirmation: ETT inserted through vocal cords under direct vision, positive ETCO2 and breath sounds checked- equal and bilateral Secured at: 23 cm Tube secured with: Tape Dental Injury: Teeth and Oropharynx as per pre-operative assessment

## 2022-08-07 NOTE — Progress Notes (Signed)
[facility]  Spoke with the patient's wife in the consultation room.  I explained that he tolerated the procedure without difficulty.  He has dissolvable stitches under the skin with overlying skin glue.  This will flake off in 10 to 14 days.  I discharged him home with a prescription for narcotic pain medication that they should take as needed for pain.  I also want him taking scheduled Tylenol.  If they take the narcotic pain medication, they should take a stool softener as well.  He should wear his abdominal binder at all times except while bathing and sleeping.  He should also not lift anything heavier than 10 pounds for the next 4 weeks.  The patient will follow-up with me in 2 weeks.  All questions were answered to her expressed satisfaction.  Graciella Freer, DO [facility] [address] [place], [place] [address] [phone] (office)

## 2022-08-07 NOTE — Op Note (Signed)
[facility] Operative Note  08/07/22  Preoperative Diagnosis: Symptomatic Cholelithiasis, umbilical hernia   Postoperative Diagnosis: Same   Procedure(s) Performed: Robotic Assisted Laparoscopic Cholecystectomy; open umbilical hernia repair   Surgeon: Graciella Freer, DO   Assistants: No qualified resident was available    Anesthesia: General endotracheal   Anesthesiologist: Louann Sjogren, MD    Specimens: Gallbladder   Estimated Blood Loss: Minimal   Blood Replacement: None    Complications: None   Wound Class: Clean contaminated   Operative Indications: The patient was found to have cholelithiasis on imaging and was symptomatic.  He also has an umbilical hernia that has been present for many years which she would like repaired at the same time.  We discussed the risk of the procedure including but not limited to bleeding, infection, injury to the common bile duct, bile leak, need for further procedures, chance of subtotal cholecystectomy, and hernia recurrence.  Patient is agreeable to surgery.  All risks and benefits of performing this procedure were discussed with the patient including pain, infection, bleeding, damage to the surrounding structures, and need for more procedures or surgery. The patient voiced understanding of the procedure, all questions were sought and answered, and consent was obtained.  Findings:  3 cm umbilical hernia, opening used for umbilical port site Critical view of safety noted All clips intact at the end of the case Adequate hemostasis   Procedure: Indocyanine green was given in the preoperative area. The patient was taken to the operating room and placed supine. General endotracheal anesthesia was induced. Intravenous antibiotics were administered per protocol.  An orogastric tube positioned to decompress the stomach. The abdomen was prepared and draped in the usual sterile fashion.  A time-out was completed verifying  correct patient, procedure, site, positioning, and implant(s) and/or special equipment prior to beginning this procedure.  The umbilical hernia was noted to be reducible and measured about 3cm. An incision was made under the umbilicus, and carried down through the subcutaneous tissue with electrocautery.  Dissection was performed down to the level of the fascia, exposing the hernia sac.  The hernia sac was opened with care, and excess hernia sac was resected with electrocautery.  A finger was ran on the underlying peritoneum and this was clear.  At this time, an 8 mm port was placed through the hernia defect.  0 Ethibond was used to approximate the remaining hernia defect to allow the abdomen to insufflate.  The abdomen was inspected and no abnormalities or injuries were found.  Under direct vision, ports were placed in the following locations in a semi curvilinear position around the target of the gallbladder: Two 8 mm ports on the patient's right each having 8cm clearance to the adjacent ports and one 8 mm port placed on the patient's left 8 cm from the umbilical port. Once ports were placed, the table was placed in the reverse Trendelenburg position with the right side up. The Xi platform was brought into the operative field and docked to the ports successfully.  An endoscope was placed through the umbilical port, ProGrasp through the most lateral right port, fenestrated bipolar to the port just right of the umbilicus, and then a hook cautery in the left port.  The omentum was noted to be adhesed to the liver.  These were carefully taken down with hook electrocautery.  The dome of the gallbladder was grasped with prograsp and retracted over the dome of the liver. Adhesions between the gallbladder and omentum, duodenum and transverse  colon were lysed via hook cautery. The infundibulum was grasped with the fenestrated grasper and retracted toward the right lower quadrant. This maneuver exposed Calot's triangle.  Firefly was used throughout the dissection to ensure safe visualization of the cystic duct.  The peritoneum overlying the gallbladder infundibulum was then dissected and the cystic duct and cystic artery identified.  Critical view of safety with the liver bed clearly visible behind the duct and artery with no additional structures noted.  The cystic duct and cystic artery were doubly clipped and divided close to the gallbladder.    The gallbladder was then dissected from its peritoneal and liver bed attachments by electrocautery.  2 pieces of surgical snow were inserted into the gallbladder fossa.  Hemostasis was checked prior to removing the hook cautery.  The Mechele Claude was undocked and moved out of the field.  A 75mm Endo Catch bag was then placed through the umbilical port and the gallbladder was removed.  The gallbladder was passed off the table as a specimen. There was no evidence of bleeding from the gallbladder fossa or cystic artery or leakage of the bile from the cystic duct stump. The abdomen was desufflated and secondary trocars were removed under direct vision.   Attention was then returned to the umbilical hernia defect.  Given that the patient just underwent cholecystectomy at the same time, decision was made to not use mesh, and to primarily close the hernia defect  The hernia defect was then closed with 0 Ethibond suture in an interrupted, figure-of-eight fashion.  The umbilicus was tacked to the fascia with a 3-0 Vicryl suture.  Hemostasis was confirmed. The skin was closed with interrupted 3-0 Vicryl and a running 4-0 Monocryl suture.  All remaining laparoscopic skin incisions were closed with subcuticular sutures of 4-0 monocryl and dermabond.   After the dermabond dried at the umbilicus, a 4 x 4 and tegaderm were placed over the umbilicus to act as a pressure dressing.  Abdominal binder will be placed in PACU.  Final inspection revealed acceptable hemostasis. All counts were correct at the end of  the case. The patient was awakened from anesthesia and extubated without complication. The OG tube was removed.  The patient went to the PACU in stable condition.   Graciella Freer, DO [facility] [address] [place], [place] [address] [phone] (office)

## 2022-08-07 NOTE — Discharge Instructions (Addendum)
Ambulatory Surgery Discharge Instructions  General Anesthesia or Sedation Do not drive or operate heavy machinery for 24 hours.  Do not consume alcohol, tranquilizers, sleeping medications, or any non-prescribed medications for 24 hours. Do not make important decisions or sign any important papers in the next 24 hours. You should have someone with you tonight at home.  Activity  You are advised to go directly home from the hospital.  Restrict your activities and rest for a day.  Resume light activity tomorrow. No heavy lifting over 10 lbs or strenuous exercise.  Wear your abdominal binder at all times except while sleeping and bathing.  Fluids and Diet Begin with clear liquids, bouillon, dry toast, soda crackers.  If not nauseated, you may go to a regular diet when you desire.  Greasy and spicy foods are not advised.  Medications  If you have not had a bowel movement in 24 hours, take 2 tablespoons over the counter Milk of mag.             You May resume your blood thinners tomorrow (Aspirin, coumadin, or other).  You are being discharged with prescriptions for Opioid/Narcotic Medications: There are some specific considerations for these medications that you should know. Opioid Meds have risks & benefits. Addiction to these meds is always a concern with prolonged use Take medication only as directed Do not drive while taking narcotic pain medication Do not crush tablets or capsules Do not use a different container than medication was dispensed in Lock the container of medication in a cool, dry place out of reach of children and pets. Opioid medication can cause addiction Do not share with anyone else (this is a felony) Do not store medications for future use. Dispose of them properly.     Disposal:  Find a [employer] household drug take back site near you.  If you can't get to a drug take back site, use the recipe below as a last resort to dispose of expired, unused or unwanted  drugs. Disposal  (Do not dispose chemotherapy drugs this way, talk to your prescribing doctor instead.) Step 1: Mix drugs (do not crush) with dirt, kitty litter, or used coffee grounds and add a small amount of water to dissolve any solid medications. Step 2: Seal drugs in plastic bag. Step 3: Place plastic bag in trash. Step 4: Take prescription container and scratch out personal information, then recycle or throw away.  Operative Site  You have a liquid bandage over your incisions, this will begin to flake off in about a week. You may remove your external belly button dressing in 2 days.  Ok to Games developer. Keep wound clean and dry. No baths or swimming. No lifting more than 10 pounds.  Contact Information: If you have questions or concerns, please call our office, [phone], Monday- Thursday 8AM-5PM and Friday 8AM-12Noon.  If it is after hours or on the weekend, please call [facility], [phone], and ask to speak to the surgeon on call for Dr. Okey Dupre at [facility].   SPECIFIC COMPLICATIONS TO WATCH FOR: Inability to urinate Fever over 101? F by mouth Nausea and vomiting lasting longer than 24 hours. Pain not relieved by medication ordered Swelling around the operative site Increased redness, warmth, hardness, around operative area Numbness, tingling, or cold fingers or toes Blood -soaked dressing, (small amounts of oozing may be normal) Increasing and progressive drainage from surgical area or exam site

## 2022-08-08 NOTE — Anesthesia Postprocedure Evaluation (Signed)
Anesthesia Post Note  Patient: Gary Ho  Procedure(s) Performed: XI ROBOTIC ASSISTED LAPAROSCOPIC CHOLECYSTECTOMY (Abdomen) OPEN UMBILICAL HERNIA REPAIR (Abdomen)  Patient location during evaluation: Phase II Anesthesia Type: General Level of consciousness: awake Pain management: pain level controlled Vital Signs Assessment: post-procedure vital signs reviewed and stable Respiratory status: spontaneous breathing and respiratory function stable Cardiovascular status: blood pressure returned to baseline and stable Postop Assessment: no headache and no apparent nausea or vomiting Anesthetic complications: no Comments: Late entry   No notable events documented.   Last Vitals:  Vitals:   08/07/22 1230 08/07/22 1238  BP:  125/77  Pulse: 71 75  Resp: 11   Temp:  36.4 C  SpO2: 91%     Last Pain:  Vitals:   08/07/22 1238  TempSrc: Oral  PainSc:                  Windell Norfolk

## 2022-08-11 ENCOUNTER — Telehealth: Payer: Self-pay | Admitting: Family Medicine

## 2022-08-11 LAB — SURGICAL PATHOLOGY

## 2022-08-11 NOTE — Telephone Encounter (Signed)
FMLA paperwork completed and faxed to HR - medical services at [phone]. Confirmation received.  Out of work starting 08/07/2022 and may return unrestricted on 09/08/2022.

## 2022-08-15 ENCOUNTER — Encounter (HOSPITAL_COMMUNITY): Payer: Self-pay | Admitting: Surgery

## 2022-08-21 ENCOUNTER — Ambulatory Visit (INDEPENDENT_AMBULATORY_CARE_PROVIDER_SITE_OTHER): Payer: 59 | Admitting: Surgery

## 2022-08-21 ENCOUNTER — Encounter: Payer: Self-pay | Admitting: Surgery

## 2022-08-21 ENCOUNTER — Encounter: Payer: Self-pay | Admitting: Family Medicine

## 2022-08-21 VITALS — BP 113/74 | HR 58 | Temp 98.1°F | Resp 14 | Ht 71.0 in | Wt 242.0 lb

## 2022-08-21 DIAGNOSIS — Z09 Encounter for follow-up examination after completed treatment for conditions other than malignant neoplasm: Secondary | ICD-10-CM

## 2022-08-21 NOTE — Progress Notes (Signed)
[facility] Note   HPI:  57 y.o. Male presents to clinic for post-op follow-up status post robotic assisted laparoscopic cholecystectomy and open umbilical hernia repair on 4/4.  Patient overall doing well.  He had some pain for the first couple days after surgery, but this is subsequently resolved.  He is tolerating a diet without nausea and vomiting.  He is also moving his bowels without issue.  Denies any issues with his incision site.  He denies fevers and chills.  Review of Systems:  All other review of systems: otherwise negative   Vital Signs:  BP 113/74   Pulse (!) 58   Temp 98.1 F (36.7 C) (Oral)   Resp 14   Ht  (1.803 m)   Wt 242 lb (109.8 kg)   SpO2 95%   BMI 33.75 kg/m    Physical Exam:  Physical Exam Vitals reviewed.  Constitutional:      Appearance: Normal appearance.  Abdominal:     Comments: Abdomen soft, nondistended, no percussion tenderness, nontender to palpation; no rigidity, guarding, rebound tenderness; laparoscopic incisions healing well with a small amount of skin glue in place, infraumbilical incision also healing well with skin glue in place, mild fullness below the umbilicus without erythema or induration  Neurological:     Mental Status: He is alert.     Laboratory studies: None   Imaging:  None   Assessment:  57 y.o. yo Male who presents for follow-up status post robotic assisted laparoscopic cholecystectomy and open umbilical hernia repair on 4/4  Plan:  -Patient overall doing well since the surgery-tolerating a diet, moving his bowels, and pain adequately controlled -Advised him that he still needs to take it easy for the next 2 to 4 weeks, as he also had a hernia repair at the time of his gallbladder surgery.  We further discussed that since I did not place the mesh at the time of surgery, he is at slightly increased risk for this hernia recurring -Patient may return to work on 5/6.  Advised that he should call our  office if he feels he needs more time prior to returning to work -Also advised the patient that he can put antibiotic ointment over his incision sites prior to showering to loosen the glue and help get the remaining skin glue off of his incisions -Follow up as needed  All of the above recommendations were discussed with the patient and patient's family, and all of patient's and family's questions were answered to their expressed satisfaction.  Theophilus Kinds, DO [facility] [address] [place], [place] [address] [phone] (office)

## 2023-01-17 ENCOUNTER — Other Ambulatory Visit: Payer: Self-pay | Admitting: Urology

## 2023-01-17 DIAGNOSIS — R3912 Poor urinary stream: Secondary | ICD-10-CM

## 2023-02-17 ENCOUNTER — Other Ambulatory Visit: Payer: Self-pay | Admitting: Urology

## 2023-02-17 DIAGNOSIS — R3912 Poor urinary stream: Secondary | ICD-10-CM

## 2023-03-16 ENCOUNTER — Other Ambulatory Visit: Payer: Self-pay | Admitting: Urology

## 2023-03-16 DIAGNOSIS — R3912 Poor urinary stream: Secondary | ICD-10-CM

## 2023-04-14 ENCOUNTER — Other Ambulatory Visit: Payer: Self-pay | Admitting: Urology

## 2023-04-14 DIAGNOSIS — R3912 Poor urinary stream: Secondary | ICD-10-CM

## 2023-05-10 ENCOUNTER — Other Ambulatory Visit: Payer: Self-pay | Admitting: Urology

## 2023-05-10 DIAGNOSIS — R3912 Poor urinary stream: Secondary | ICD-10-CM

## 2023-06-15 ENCOUNTER — Other Ambulatory Visit: Payer: Self-pay | Admitting: Urology

## 2023-06-15 DIAGNOSIS — R3912 Poor urinary stream: Secondary | ICD-10-CM

## 2023-07-13 ENCOUNTER — Other Ambulatory Visit: Payer: Self-pay | Admitting: Urology

## 2023-07-13 DIAGNOSIS — R3912 Poor urinary stream: Secondary | ICD-10-CM

## 2023-08-11 ENCOUNTER — Other Ambulatory Visit: Payer: Self-pay | Admitting: Urology

## 2023-08-11 DIAGNOSIS — R3912 Poor urinary stream: Secondary | ICD-10-CM

## 2023-08-18 ENCOUNTER — Other Ambulatory Visit: Payer: Self-pay

## 2023-08-18 DIAGNOSIS — R3912 Poor urinary stream: Secondary | ICD-10-CM

## 2023-08-18 MED ORDER — TAMSULOSIN HCL 0.4 MG PO CAPS
0.4000 mg | ORAL_CAPSULE | Freq: Every day | ORAL | 2 refills | Status: AC
Start: 2023-08-18 — End: ?

## 2023-11-20 ENCOUNTER — Ambulatory Visit: Admitting: Urology

## 2024-02-25 ENCOUNTER — Other Ambulatory Visit: Payer: Self-pay | Admitting: Urology

## 2024-02-25 DIAGNOSIS — R3912 Poor urinary stream: Secondary | ICD-10-CM

## 2024-04-08 ENCOUNTER — Ambulatory Visit: Admitting: Urology
